# Patient Record
Sex: Female | Born: 1960 | Hispanic: Refuse to answer | Marital: Married
Health system: Midwestern US, Community
[De-identification: ages and names within clinical notes are randomized; demographics above are authoritative.]

## PROBLEM LIST (undated history)

## (undated) DIAGNOSIS — R928 Other abnormal and inconclusive findings on diagnostic imaging of breast: Principal | ICD-10-CM

## (undated) DIAGNOSIS — Z0000 Encounter for general adult medical examination without abnormal findings: Secondary | ICD-10-CM

## (undated) DIAGNOSIS — O039 Complete or unspecified spontaneous abortion without complication: Secondary | ICD-10-CM

## (undated) DIAGNOSIS — M1990 Unspecified osteoarthritis, unspecified site: Secondary | ICD-10-CM

## (undated) DIAGNOSIS — J329 Chronic sinusitis, unspecified: Secondary | ICD-10-CM

## (undated) HISTORY — DX: Complete or unspecified spontaneous abortion without complication: O03.9

## (undated) HISTORY — DX: Chronic sinusitis, unspecified: J32.9

## (undated) HISTORY — DX: Unspecified osteoarthritis, unspecified site: M19.90

---

## 2004-07-04 HISTORY — PX: BREAST EXCISIONAL BIOPSY: SUR124

## 2010-10-03 HISTORY — PX: OTHER SURGICAL HISTORY: SHX169

## 2010-10-05 ENCOUNTER — Inpatient Hospital Stay (HOSPITAL_COMMUNITY)
Admission: AD | Admit: 2010-10-05 | Discharge: 2010-10-08 | DRG: 219 | Disposition: A | Discharge status: Home / Self Care | Payer: BC Managed Care – PPO | Source: Ambulatory Visit | Attending: Orthopedic Surgery | Admitting: Orthopedic Surgery

## 2010-10-05 DIAGNOSIS — Y9379 Activity, other specified sports and athletics: Secondary | ICD-10-CM

## 2010-10-05 DIAGNOSIS — Y998 Other external cause status: Secondary | ICD-10-CM

## 2010-10-05 DIAGNOSIS — S42293A Other displaced fracture of upper end of unspecified humerus, initial encounter for closed fracture: Principal | ICD-10-CM | POA: Diagnosis present

## 2010-10-05 LAB — SURGICAL PCR SCREEN: Staphylococcus aureus: NEGATIVE

## 2010-10-05 LAB — BASIC METABOLIC PANEL
BUN: 9 mg/dL (ref 6–23)
CO2: 30 mEq/L (ref 19–32)
Chloride: 101 mEq/L (ref 96–112)
Creatinine, Ser: 0.72 mg/dL (ref 0.4–1.2)
Glucose, Bld: 98 mg/dL (ref 70–99)
Potassium: 3.4 mEq/L — ABNORMAL LOW (ref 3.5–5.1)

## 2010-10-05 LAB — APTT: aPTT: 36 seconds (ref 24–37)

## 2010-10-05 LAB — CBC
MCHC: 33.4 g/dL (ref 30.0–36.0)
MCV: 91.3 fL (ref 78.0–100.0)
Platelets: 247 10*3/uL (ref 150–400)
RDW: 12.8 % (ref 11.5–15.5)
WBC: 8.4 10*3/uL (ref 4.0–10.5)

## 2010-10-06 ENCOUNTER — Inpatient Hospital Stay (HOSPITAL_COMMUNITY): Payer: BC Managed Care – PPO

## 2010-10-06 NOTE — H&P (Signed)
  Kelly Moore, STOFFER           ACCOUNT NO.:  [id number]  MEDICAL RECORD NO.:  [id number]           PATIENT TYPE:  I  LOCATION:  5041                         FACILITY:  [facility]  PHYSICIAN:  Almedia Balls. Ranell Patrick, M.D. DATE OF BIRTH:  [birth year]  DATE OF ADMISSION:  10/05/2010 DATE OF DISCHARGE:                             HISTORY & PHYSICAL   REASON FOR ADMISSION:  Left shoulder fracture.  HISTORY OF PRESENT ILLNESS:  The patient is a 50 year old left-hand dominant female who sustained a left proximal humerus fracture falling from horse on Sunday.  The patient initially went to [facility], where x-rays were diagnosing a displaced proximal humerus fracture.  Attempts at closure and reduction of the shoulder was not successful.  The patient was referred to [facility] for surgical management.  PAST MEDICAL HISTORY:  Unremarkable.  PAST SURGICAL HISTORY:  Lumpectomy for benign tumor in 2006.  FAMILY MEDICAL HISTORY:  Negative for joint problems.  CURRENT MEDICATIONS:  None.  ALLERGIES:  None.  SOCIAL HISTORY:  The patient sees Dr. Belva Crome down in [place] for primary care.  She denies tobacco use or alcohol use, is left-hand dominant, here with her husband, works for [employer].  PHYSICAL EXAMINATION:  GENERAL:  Healthy-appearing female who is in no acute distress, alert and oriented. NEUROLOGIC:  Examination of cervical spine reveals no midline tenderness.  Normal cervical range of motion.  Left shoulder very swollen and tender.  She is bruised and unable to move her shoulder secondary to her fractured shoulder.  She is in a sling and swathe. Distally, she is able wiggle her fingers and sensation is intact.  Radiographs demonstrate a displaced proximal humerus fracture with 100% anterior shaft displacement.  IMPRESSION:  Left displaced proximal humerus fracture.  PLAN:  Admission for pain control to the hospital.  The patient is fairly miserable and is having to  take Percocets quite frequently.  We will plan on surgery.  We discussed both surgical and nonsurgical treatment options.  With the displacing force of the pectoralis major, this fracture is not amenable to nonsurgical care, and we recommended surgical management.  She is in agreement and willing to proceed and we will plan on that tomorrow with an S3 plate, which was a locking plate.     Almedia Balls. Ranell Patrick, M.D.     SRN/MEDQ  D:  10/05/2010  T:  10/06/2010  Job:  [id number]  Electronically Signed by Malon Kindle  on 10/06/2010 08:08:45 PM

## 2010-10-07 LAB — BASIC METABOLIC PANEL
BUN: 8 mg/dL (ref 6–23)
CO2: 30 mEq/L (ref 19–32)
Calcium: 8.6 mg/dL (ref 8.4–10.5)
Chloride: 103 mEq/L (ref 96–112)
Creatinine, Ser: 0.77 mg/dL (ref 0.4–1.2)
Glucose, Bld: 111 mg/dL — ABNORMAL HIGH (ref 70–99)

## 2010-10-07 LAB — CBC
HCT: 34.7 % — ABNORMAL LOW (ref 36.0–46.0)
Hemoglobin: 11.5 g/dL — ABNORMAL LOW (ref 12.0–15.0)
MCH: 30.2 pg (ref 26.0–34.0)
MCHC: 33.1 g/dL (ref 30.0–36.0)
MCV: 91.1 fL (ref 78.0–100.0)
RBC: 3.81 MIL/uL — ABNORMAL LOW (ref 3.87–5.11)

## 2010-10-27 NOTE — Discharge Summary (Signed)
  Kelly Moore, STOKER           ACCOUNT NO.:  [id number]  MEDICAL RECORD NO.:  [id number]           PATIENT TYPE:  I  LOCATION:  5041                         FACILITY:  [facility]  PHYSICIAN:  Almedia Balls. Ranell Patrick, M.D. DATE OF BIRTH:  January 14, 1961  DATE OF ADMISSION:  10/05/2010 DATE OF DISCHARGE:  10/07/2010                              DISCHARGE SUMMARY   ADMISSION DIAGNOSIS:  Left proximal humerus fracture.  DISCHARGE DIAGNOSIS:  Left proximal humerus fracture status post open reduction and internal fixation.  BRIEF HISTORY:  The patient is a 50 year old female who sustained a ground-level fall from a horse earlier in the week injuring the left shoulder.  The patient was admitted for pain management and also surgical management to increase function and decrease pain.  PROCEDURE:  The patient had a left proximal humerus ORIF by Dr. Malon Kindle on October 06, 2010.  Assistant was [employer].  General anesthesia with interscalene block was used.  No complications.  HOSPITAL COURSE:  The patient was admitted on October 05, 2010, for pain management and also get her ready for surgery on the 4th.  The patient underwent the above-stated procedure on October 06, 2010, which she tolerated well.  After adequate time-out in Post Anesthesia Care Unit, she was transferred back to 5000.  Postop day #1, the patient complained of mild to moderate pain but otherwise was doing fairly well, so worked with occupational therapy very gently and was continued to improve throughout the day and thus the patient will be discharged home on postop day #1.  Her labs were within normal limits and otherwise she did quite well, was afebrile.  DISCHARGE PLAN:  The patient will be discharged home on October 07, 2010. Her diet is regular.  Her condition is stable.  DISCHARGE MEDICATIONS: 1. Robaxin 500 mg p.o. q.6 h. 2. Percocet 5/325 1-2 tablets q.4-6 h. p.r.n. pain.  FOLLOWUP:  The patient to follow back up  with Dr. Malon Kindle in 2 weeks.  The patient has no known drug allergies.     Thomas B. Dixon, P.A.   ______________________________ Almedia Balls. Ranell Patrick, M.D.    TBD/MEDQ  D:  10/07/2010  T:  10/08/2010  Job:  [id number]  Electronically Signed by Standley Dakins P.A. on 10/19/2010 10:18:36 AM Electronically Signed by Malon Kindle  on 10/27/2010 05:15:44 PM

## 2010-10-27 NOTE — Op Note (Signed)
NAMEBETSY, Kelly Moore           ACCOUNT NO.:  [id number]  MEDICAL RECORD NO.:  [id number]           PATIENT TYPE:  I  LOCATION:  5041                         FACILITY:  [facility]  PHYSICIAN:  Almedia Balls. Ranell Patrick, M.D. DATE OF BIRTH:  Dec 11, 1960  DATE OF PROCEDURE:  10/06/2010 DATE OF DISCHARGE:                              OPERATIVE REPORT   PREOPERATIVE DIAGNOSIS:  Left shoulder displaced and comminuted proximal humerus fracture.  POSTOPERATIVE DIAGNOSIS:  Left shoulder displaced and comminuted proximal humerus fracture.  PROCEDURE PERFORMED:  Open reduction and internal fixation of left proximal humerus using DePuy SNP.  SURGEON:  Almedia Balls. Ranell Patrick, MD  ASSISTANT:  [place]esto Charon, [place].  General anesthesia was used plus interscalene block.  ESTIMATED BLOOD LOSS:  Less than 100 mL.  FLUID REPLACED:  1500 mL crystalloid.  Counts were correct.  There were no complications.  Preoperative antibiotics were given.  INDICATIONS:  The patient is a 49-year female with left displaced proximal humerus fracture sustained falling from a horse.  The patient presents with a significantly angulated fracture with complete displacement of fracture site.  I discussed with the patient the recommendation for open reduction and internal fixation and restore proximal humeral anatomy.  She agreed with this.  Informed consent was obtained.  DESCRIPTION OF PROCEDURE:  After adequate level of anesthesia was achieved, the patient was placed in a modified beach-chair position. Left shoulder was sterilely prepped and draped in usual manner.  Using deltopectoral incision starting at the coracoid process extending down the anterior humerus, dissection down through the subcutaneous tissue. Cephalic vein identified, taken laterally with the deltoid, pectoralis taken medially.  Pectoralis was indeed attached to the distal fragment, was pulling it anteriorly and proximally.  We were able to identify  the fracture site.  There was comminution noted there that was not noted on preoperative x-rays.  I made a decision at this time to proceed with a nail plate device which seemed to well in a comminuted situation providing excellent stability, but not relying on completely rigid stability and fixation for successive healing.  We went ahead and removed just a little bit of bone off the distal fragment on the lateral side just lateral to the biceps groove.  We then placed the SNP in the distal humerus.  We then reduced the fracture anatomically and placed a clamp across the fracture site and that was there at the oblique portion of the fracture distal to the head and the diaphysis portion.  We then checked our reduction on multiple views with C-arm.  We placed our central localizing pin into the center of the head and then started placing our non-threaded pegs which were locking pegs, did lock into the plate.  We got 5 good pegs, all checked in multiple views with C-arm and were in bone.  At this point, we went ahead and placed our 3 distal unicortical screws that locked into plate.  We had the top screw cross threaded and sheared off, but did locked in the plate and the bottom 2 screws had good purchase.  Once we had verified that all hardware was in appropriate position, we removed  the clamp and ranged the shoulder fully, saw absolutely no relative motion at all through the full arc of motion, felt like we had a nice stable construct and near-anatomic reduction.  I thoroughly irrigated the wounds and then closed the deltopectoral interval with 0 Vicryl suture followed 2-0 Vicryl suture followed by 4-0 Monocryl to skin.  Steri-Strips applied followed by sterile dressing.  The patient tolerated the surgery well.     Almedia Balls. Ranell Patrick, M.D.     SRN/MEDQ  D:  10/06/2010  T:  10/07/2010  Job:  [id number]  Electronically Signed by Malon Kindle  on 10/27/2010 05:15:47 PM

## 2012-05-09 IMAGING — CR DG SHOULDER 1V*L*
1 series · 1 of 1 positions shown · non-contrast
Comparison: None.

CLINICAL DATA: Postop for shoulder surgery.

PORTABLE LEFT SHOULDER - 2+ VIEW

[view not recorded]
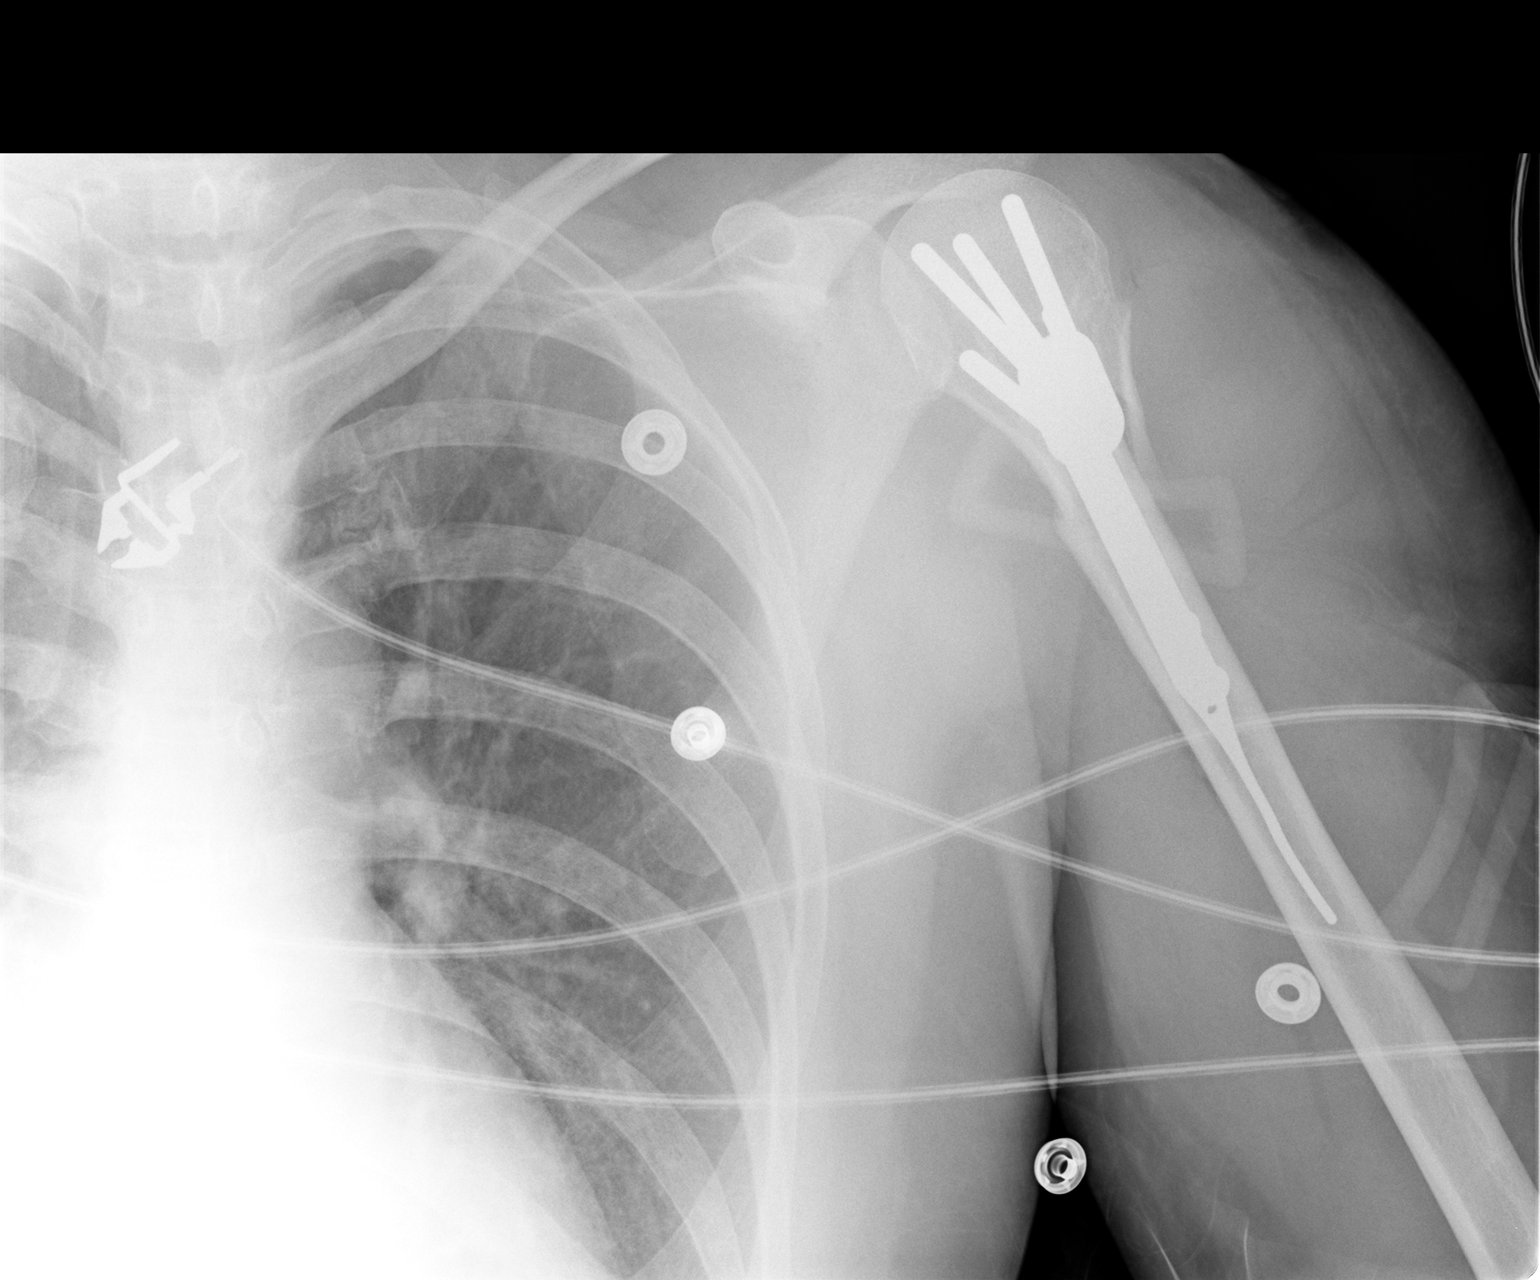

[1 of 1 positions shown; findings below may reference images not displayed]

FINDINGS: Single AP view of the left shoulder.  Postoperative
changes in the proximal left humerus with apparent fixation of a
proximal humeral fracture.  Near anatomic alignment is suggested in
the single view available.
IMPRESSION: Postoperative change left shoulder.

## 2015-09-17 DIAGNOSIS — M722 Plantar fascial fibromatosis: Secondary | ICD-10-CM | POA: Insufficient documentation

## 2015-09-17 DIAGNOSIS — M6700 Short Achilles tendon (acquired), unspecified ankle: Secondary | ICD-10-CM | POA: Insufficient documentation

## 2015-09-17 DIAGNOSIS — Q828 Other specified congenital malformations of skin: Secondary | ICD-10-CM | POA: Insufficient documentation

## 2015-12-30 ENCOUNTER — Ambulatory Visit (INDEPENDENT_AMBULATORY_CARE_PROVIDER_SITE_OTHER): Payer: BLUE CROSS/BLUE SHIELD | Admitting: Sports Medicine

## 2015-12-30 ENCOUNTER — Encounter: Payer: Self-pay | Admitting: Sports Medicine

## 2015-12-30 DIAGNOSIS — M79672 Pain in left foot: Secondary | ICD-10-CM

## 2015-12-30 DIAGNOSIS — Q828 Other specified congenital malformations of skin: Secondary | ICD-10-CM

## 2015-12-30 NOTE — Progress Notes (Signed)
Patient ID: Kelly Moore, female   DOB: 10/12/1960, 55 y.o.   MRN: [id number] Subjective: Kelly GawCatherine Moore is a 55 y.o. female patient who presents to office for evaluation of Left foot pain secondary to callus skin. Patient complains of pain at the lesion present Left foot at the heel; feels like something is in her heel; states that started to worsen in february went to urgent care with give antibiotics with no resolution. Also saw a podiatrist who trimmed it and was told to use compound W with no improvement.  Patient has tried soaking with no relief in symptoms. Admits to a history of plantar fasciitis and [place] Patient denies any other pedal complaints.   Patient Active Problem List   Diagnosis Date Noted  . Plantar fasciitis 09/17/2015  . Porokeratosis 09/17/2015  . 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   No current facility-administered medications on file prior to visit.    No Known Allergies  Objective:  General: Alert and oriented x3 in no acute distress  Dermatology: Keratotic lesion present left heel with skin lines transversing the lesion, pain is present with direct pressure to the lesion with a central nucleated core noted, no webspace macerations, no ecchymosis bilateral, all nails x 10 are well manicured.  Vascular: Dorsalis Pedis and Posterior Tibial pedal pulses 2/4, Capillary Fill Time 3 seconds, + pedal hair growth bilateral, no edema bilateral lower extremities, Temperature gradient within normal limits.  Neurology: Michaell CowingGross sensation intact via light touch bilateral.  Musculoskeletal: Mild tenderness with palpation at the keratotic lesion site on Left, Muscular strength 5/5 in all groups without pain or limitation on range of motion. No lower extremity muscular or boney deformity noted.  Assessment and Plan: Problem List Items Addressed This Visit       Musculoskeletal and Integument   Porokeratosis - Primary    Other Visit Diagnoses    Pain, heel, left          -Complete examination performed -Discussed treatment options -Parred keratoic lesion using a chisel blade; treated the area with Salinocaine covered with moleskin -Encouraged daily skin emollients -Encouraged use of pumice stone -Advised good supportive shoes and inserts -Patient to return to office as needed or sooner if condition worsens. Advised patient to consider punch removal of lesion if no relief after today's trim.   Asencion Islamitorya Marjie Chea, DPM

## 2019-07-24 ENCOUNTER — Other Ambulatory Visit: Payer: Self-pay

## 2019-07-24 ENCOUNTER — Encounter: Payer: Self-pay | Admitting: Diagnostic Neuroimaging

## 2019-07-24 ENCOUNTER — Ambulatory Visit: Payer: BC Managed Care – PPO | Admitting: Diagnostic Neuroimaging

## 2019-07-24 VITALS — BP 122/77 | HR 118 | Temp 96.6°F | Ht 64.0 in | Wt 119.6 lb

## 2019-07-24 DIAGNOSIS — R6884 Jaw pain: Secondary | ICD-10-CM

## 2019-07-24 MED ORDER — CARBAMAZEPINE ER 200 MG PO CP12: 200.0000 mg | ORAL_CAPSULE | Freq: Two times a day (BID) | ORAL | 6 refills | Status: AC

## 2019-07-24 NOTE — Patient Instructions (Signed)
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

## 2019-07-24 NOTE — Progress Notes (Signed)
[facility]  PATIENT: Kelly Moore DOB: Nov 04, 1960  REFERRING CLINICIAN: Greig Right, MD HISTORY FROM: patient  REASON FOR VISIT: new consult    HISTORICAL  CHIEF COMPLAINT:  Chief Complaint  Patient presents with  . Pain    rm 6 New Pt, husband- Ronalee Belts "facial pain in April, dentist dx TMJ, made mouth splint and it went away; came back 2 weeks ago, last Fri was electrical shocks of pain; went to oral surgeon who stated it's not TMJ or related to my teeth"    HISTORY OF PRESENT ILLNESS:   59 year old female here for evaluation of right facial pain.  Symptoms started in April 2020 and lasted for a week or 2 and then resolved.  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  Symptoms recurred in January 2021.  Started with dull aching pain, progressing to intense severe electrical shocklike pain in her right lower jaw.  Patient went to dentist, oral surgeon, had CT scan of her jaw and teeth which were unremarkable.  No specific dental pathology was found.  Patient was referred here for further evaluation.  Patient having trouble eating, talking, relaxing.  Patient has tried muscle relaxers, hydrocodone, pain medications without relief.  Also patient had diagnosis of Covid in June 29, 2019.  She has had loss of taste since that time.    REVIEW OF SYSTEMS: Full 14 system review of systems performed and negative with exception of: As per HPI.  ALLERGIES: No Known Allergies  HOME MEDICATIONS: Outpatient Medications Prior to Visit  Medication Sig Dispense Refill  . diazepam (VALIUM) 5 MG tablet 5 mg. 1-2 x day as needed    . HYDROcodone-acetaminophen (NORCO/VICODIN) 5-325 MG tablet 1 tablet every 6 (six) hours. As needed    . meloxicam (MOBIC) 15 MG tablet Take 15 mg by mouth.    Karma Greaser 4 MG/0.1ML LIQD nasal spray kit 1 spray once. As needed    . amoxicillin-clavulanate (AUGMENTIN) 875-125 MG tablet     .  levofloxacin (LEVAQUIN) 750 MG tablet     . meloxicam (MOBIC) 15 MG tablet     . methocarbamol (ROBAXIN) 750 MG tablet Take 750 mg by mouth 5 (five) times daily as needed. Every 4 hrs as needed    . predniSONE (DELTASONE) 20 MG tablet     . PROAIR HFA 108 (90 Base) MCG/ACT inhaler      No facility-administered medications prior to visit.    PAST MEDICAL HISTORY: Past Medical History:  Diagnosis Date  . Arthritis   . Miscarriage   . Sinusitis    hx of    PAST SURGICAL HISTORY: Past Surgical History:  Procedure Laterality Date  . arm fracture Left 10/2010   metal plate  . BREAST LUMPECTOMY Right 2006    FAMILY HISTORY: Family History  Problem Relation Age of Onset  . Breast cancer Mother   . Dementia Mother   . Prostate cancer Father   . Hypertension Father   . Heart attack Father   . Thyroid disease Sister   . Stroke Brother     SOCIAL HISTORY: Social History   Socioeconomic History  . Marital status: Married    Spouse name: Ronalee Belts  . Number of children: 0  . Years of education: Not on file  . Highest education level: High school graduate  Occupational History  . Not on file  Tobacco Use  . Smoking status: Never Smoker  . Smokeless tobacco:  Never Used  Substance and Sexual Activity  . Alcohol use: Yes    Alcohol/week: 0.0 standard drinks    Comment: occass  . Drug use: Never  . Sexual activity: Not on file  Other Topics Concern  . Not on file  Social History Narrative   Lives with husband   Caffeine -coffee 2-3 c, occass tea   Social Determinants of Health   Financial Resource Strain:   . Difficulty of Paying Living Expenses: Not on file  Food Insecurity:   . Worried About Charity fundraiser in the Last Year: Not on file  . Ran Out of Food in the Last Year: Not on file  Transportation Needs:   . Lack of Transportation (Medical): Not on file  . Lack of Transportation (Non-Medical): Not on file  Physical Activity:   . Days of Exercise per Week:  Not on file  . Minutes of Exercise per Session: Not on file  Stress:   . Feeling of Stress : Not on file  Social Connections:   . Frequency of Communication with Friends and Family: Not on file  . Frequency of Social Gatherings with Friends and Family: Not on file  . Attends Religious Services: Not on file  . Active Member of Clubs or Organizations: Not on file  . Attends Archivist Meetings: Not on file  . Marital Status: Not on file  Intimate Partner Violence:   . Fear of Current or Ex-Partner: Not on file  . Emotionally Abused: Not on file  . Physically Abused: Not on file  . Sexually Abused: Not on file     PHYSICAL EXAM  GENERAL EXAM/CONSTITUTIONAL: Vitals:  Vitals:   07/24/19 1039  BP: 122/77  Pulse: (!) 118  Temp: (!) 96.6 F (35.9 C)  Weight: 119 lb 9.6 oz (54.3 kg)  Height: '5\' 4"'$  (1.626 m)   Body mass index is 20.53 kg/m. Wt Readings from Last 3 Encounters:  07/24/19 119 lb 9.6 oz (54.3 kg)    Patient is in [place] / DISTRESS FROM RIGHT JAW PAIN; well developed, nourished and groomed; neck is supple  CARDIOVASCULAR:  Examination of carotid arteries is normal; no carotid bruits  Regular rate and rhythm, no murmurs  Examination of peripheral vascular system by observation and palpation is normal  EYES:  Ophthalmoscopic exam of optic discs and posterior segments is normal; no papilledema or hemorrhages No exam data present  MUSCULOSKELETAL:  Gait, strength, tone, movements noted in Neurologic exam below  NEUROLOGIC: MENTAL STATUS:  No flowsheet data found.  awake, alert, oriented to person, place and time  recent and remote memory intact  normal attention and concentration  language fluent, comprehension intact, naming intact  fund of knowledge appropriate  CRANIAL NERVE:   2nd - no papilledema on fundoscopic exam  2nd, 3rd, 4th, 6th - pupils equal and reactive to light, visual fields full to confrontation, extraocular  muscles intact, no nystagmus  5th - facial sensation symmetric  7th - facial strength symmetric  8th - hearing intact  9th - palate elevates symmetrically, uvula midline  11th - shoulder shrug symmetric  12th - tongue protrusion midline  MOTOR:   normal bulk and tone, full strength in the BUE, BLE  SENSORY:   normal and symmetric to light touch, temperature, vibration  COORDINATION:   finger-nose-finger, fine finger movements normal  REFLEXES:   deep tendon reflexes present and symmetric  GAIT/STATION:   narrow based gait     DIAGNOSTIC DATA (LABS,  IMAGING, TESTING) - I reviewed patient records, labs, notes, testing and imaging myself where available.  Lab Results  Component Value Date   WBC 6.9 10/07/2010   HGB 11.5 (L) 10/07/2010   HCT 34.7 (L) 10/07/2010   MCV 91.1 10/07/2010   PLT 204 10/07/2010      Component Value Date/Time   NA 141 10/07/2010 0335   K 3.7 10/07/2010 0335   CL 103 10/07/2010 0335   CO2 30 10/07/2010 0335   GLUCOSE 111 (H) 10/07/2010 0335   BUN 8 10/07/2010 0335   CREATININE 0.77 10/07/2010 0335   CALCIUM 8.6 10/07/2010 0335   GFRNONAA >60 10/07/2010 0335   GFRAA  10/07/2010 0335    >60        The eGFR has been calculated using the MDRD equation. This calculation has not been validated in all clinical situations. eGFR's persistently <60 mL/min signify possible Chronic Kidney Disease.   No results found for: CHOL, HDL, LDLCALC, LDLDIRECT, TRIG, CHOLHDL No results found for: HGBA1C No results found for: VITAMINB12 No results found for: TSH     ASSESSMENT AND PLAN  59 y.o. year old female here with right lower jaw and facial pain since April 2020, intermittent shocklike sensations, most likely representing trigeminal neuralgia.  Will check MRI of the brain to rule out secondary causes.  We will empirically try carbamazepine for symptom control.  Dx:  1. Jaw pain     PLAN:  RIGHT TRIGEMINAL NEURALGIA - start  carbamazepine 295m twice a day  - check MRI brain w/wo  Orders Placed This Encounter  Procedures  . MR BRAIN W WO CONTRAST   Meds ordered this encounter  Medications  . carbamazepine (CARBATROL) 200 MG 12 hr capsule    Sig: Take 1 capsule (200 mg total) by mouth 2 (two) times daily.    Dispense:  60 capsule    Refill:  6   Return in about 3 months (around 10/22/2019).    VPenni Bombard MD 13/73/4287 168:11AM Certified in Neurology, Neurophysiology and Neuroimaging  [facility] [address] [place] [phone]

## 2019-07-25 ENCOUNTER — Telehealth: Payer: Self-pay | Admitting: Diagnostic Neuroimaging

## 2019-07-25 NOTE — Telephone Encounter (Signed)
Kelly Moore,Kelly on DPR) has called to report that something needs to be done re: pt working.  Husband states pt unable to work due to high level of pain and also pt is not eating much at all.  Pt husband is asking for a call from RN

## 2019-07-25 NOTE — Telephone Encounter (Signed)
LVM advising husband to have her talk to HR at her work to discuss being out of work. If forms need to be filled out, her PCP can or we will fill out for $50 fee. I advised that hopefully her medication will begin to make her feel better soon. She can try protein drinks, drink water so she doesn't get dehydrated. Left # for questions.

## 2019-08-01 ENCOUNTER — Telehealth: Payer: Self-pay

## 2019-08-01 NOTE — Telephone Encounter (Signed)
Called patient and advised Dr Marjory Lies recommends she go to ED or Urgent Care asap. She asked if she should go even if she is feeling somewhat better. I advised her she should particularly due to shortness of breath and fluctuating HR. She asked if about PCP, and I advised she go to ED or Urgent Care. She then asked about her MRI; I advised will let MRI coordinators know she is asking . I advised her the process of getting insurance approval can take 1-2 weeks. She agreed to seek medical attention,  verbalized understanding, appreciation.

## 2019-08-01 NOTE — Telephone Encounter (Signed)
Recommend to stop medication and go to ER / urgent care asap. -VRP

## 2019-08-01 NOTE — Telephone Encounter (Signed)
I took call from phone staff. Patient reports bruising, a red rash, shaking, pulse rates from 30's to 90's, shortness of breath, and tingling in hands. She has been on  Carbamazepine 200 mg twice daily since 07/24/19. She has taken her morning dose today. I advised her will discuss with Dr Marjory Lies and call her back. In meantime if any of her symptoms worsen I advised she call seek medical attention. She expressed concern over stopping medication "suddenlly". I reassured her that her next dose today isn't due until tonight, and I'll call her before then. She verbalized understanding, appreciation.

## 2019-08-01 NOTE — Telephone Encounter (Signed)
Patient called stating she is having a medication reaction to the carbamazepine (CARBATROL) 200 MG 12 hr capsule  States she has developed a rash, balance is off, trouble breathing, shortness of breath, tingling/shaking in both hands

## 2019-08-01 NOTE — Telephone Encounter (Signed)
Called patient who stated she went to urgent care. They sent her to [facility] ED where she had EKG, CXR, labs. She was told everything was okay.  She feels better. I advised her we are closed tomorrow, but we have MD on call on weekends. She will not take carbamazepine any longer. She  verbalized understanding, appreciation of call.

## 2019-08-06 ENCOUNTER — Telehealth: Payer: Self-pay | Admitting: Diagnostic Neuroimaging

## 2019-08-06 NOTE — Telephone Encounter (Signed)
[employer] Auth: [id number] (exp. 08/06/19 to 10/04/19) order sent to [facility]. They will reach out to the patient to schedule.Marland Kitchen [facility] is out of network for the MRI [facility] is in network.

## 2019-08-08 ENCOUNTER — Telehealth: Payer: Self-pay | Admitting: *Deleted

## 2019-08-08 NOTE — Telephone Encounter (Signed)
Received call from patient who was crying with facial pain. She stated that she had to go to   Urgent care Sunday for her pain, was prescribed gabapentin, buspirone, baclofen, hydrocodone. She stated that she then saw PCP who increased gabapentin from 300 mg twice daily to 600 mg twice daily. PCP added hydroxyzine which she hasn't picked up. She is having a lot of difficulty eating/drinking.   I messaged Dr Marjory Lies who recommended she continue all medications, and if not able to eat, may need admission to hospital by PCP.  He can also refer to [facility] for gamma knife/surgery evaluation.  I advised the patient continue all meds as prescribed, take hydrocodone every 6 hours to try and get pain under control. If unable to eat, drink she may need to be admitted. She asked if she should go to [facility], I informed her that it is her choice and she can request medical records be sent to Dr Marjory Lies, as [facility] is not in [facility]. She then asked me to repeat information to husband; I did so. He stated they will call her PCP. I informed him we have MD on call on weekends. He verbalized understanding, appreciation.

## 2019-08-17 ENCOUNTER — Ambulatory Visit
Admission: RE | Admit: 2019-08-17 | Discharge: 2019-08-17 | Disposition: A | Discharge status: Home / Self Care | Payer: BC Managed Care – PPO | Source: Ambulatory Visit | Attending: Diagnostic Neuroimaging | Admitting: Diagnostic Neuroimaging

## 2019-08-17 DIAGNOSIS — R6884 Jaw pain: Secondary | ICD-10-CM

## 2019-08-17 MED ORDER — GADOBENATE DIMEGLUMINE 529 MG/ML IV SOLN
10.0000 mL | Freq: Once | INTRAVENOUS | Status: AC | PRN
  Administered 2019-08-17: 16:00:00 10 mL via INTRAVENOUS

## 2019-08-26 ENCOUNTER — Telehealth: Payer: Self-pay | Admitting: Diagnostic Neuroimaging

## 2019-08-26 DIAGNOSIS — R6884 Jaw pain: Secondary | ICD-10-CM

## 2019-08-26 NOTE — Telephone Encounter (Signed)
Pt is asking for a call with MRI results once available

## 2019-08-27 NOTE — Telephone Encounter (Signed)
See result note. _VRP 

## 2019-08-28 NOTE — Telephone Encounter (Signed)
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 She may consider a neurosurgery consult for evaluation and discussion of possible surgery options or treatment.  She would like referral placed. I advised Dr Marjory Lies will place referral. She needs to give 1-2 weeks to get call for scheduling with neurosurgeon. She verbalized understanding, appreciation.

## 2019-08-29 NOTE — Addendum Note (Signed)
Addended by: [place] Pink on: 08/29/2019 04:26 PM   Modules accepted: Orders

## 2019-08-29 NOTE — Telephone Encounter (Signed)
Per Dr Marjory Lies, referral placed to Dr Angelyn Punt, [facility] for evaluation of MRI brian/IAC results, re: jaw pain.

## 2019-09-11 ENCOUNTER — Encounter: Payer: Self-pay | Admitting: *Deleted

## 2019-10-22 ENCOUNTER — Ambulatory Visit: Payer: BC Managed Care – PPO | Admitting: Diagnostic Neuroimaging

## 2021-02-12 ENCOUNTER — Other Ambulatory Visit: Payer: Self-pay | Admitting: Family Medicine

## 2021-02-12 DIAGNOSIS — R928 Other abnormal and inconclusive findings on diagnostic imaging of breast: Secondary | ICD-10-CM

## 2021-03-01 ENCOUNTER — Other Ambulatory Visit: Payer: Self-pay

## 2021-03-01 ENCOUNTER — Other Ambulatory Visit: Payer: Self-pay | Admitting: Family Medicine

## 2021-03-01 ENCOUNTER — Ambulatory Visit
Admission: RE | Admit: 2021-03-01 | Discharge: 2021-03-01 | Disposition: A | Discharge status: Home / Self Care | Payer: BC Managed Care – PPO | Source: Ambulatory Visit | Attending: Family Medicine | Admitting: Family Medicine

## 2021-03-01 ENCOUNTER — Ambulatory Visit: Admission: RE | Admit: 2021-03-01 | Payer: BC Managed Care – PPO | Source: Ambulatory Visit

## 2021-03-01 DIAGNOSIS — R921 Mammographic calcification found on diagnostic imaging of breast: Secondary | ICD-10-CM

## 2021-03-01 DIAGNOSIS — R928 Other abnormal and inconclusive findings on diagnostic imaging of breast: Secondary | ICD-10-CM

## 2021-03-11 ENCOUNTER — Ambulatory Visit
Admission: RE | Admit: 2021-03-11 | Discharge: 2021-03-11 | Disposition: A | Discharge status: Home / Self Care | Payer: BC Managed Care – PPO | Source: Ambulatory Visit | Attending: Family Medicine | Admitting: Family Medicine

## 2021-03-11 ENCOUNTER — Other Ambulatory Visit: Payer: Self-pay

## 2021-03-11 DIAGNOSIS — R921 Mammographic calcification found on diagnostic imaging of breast: Secondary | ICD-10-CM

## 2021-12-25 LAB — FECAL DNA COLORECTAL CANCER SCREENING (COLOGUARD): FIT-DNA (Cologuard): NEGATIVE

## 2022-10-13 IMAGING — MG MM BREAST BX W LOC DEV EA AD LESION IMG BX SPEC STEREO GUIDE*L*
5 series · 5 of 17 positions shown · non-contrast
Comparison: Previous exams.
COMPARISON: Previous exams.

Addendum:
CLINICAL DATA: Patient presents for stereotactic guided core biopsy
of LEFT breast calcifications.

EXAM:
LEFT BREAST STEREOTACTIC CORE NEEDLE BIOPSY

[L (1 of 2)]
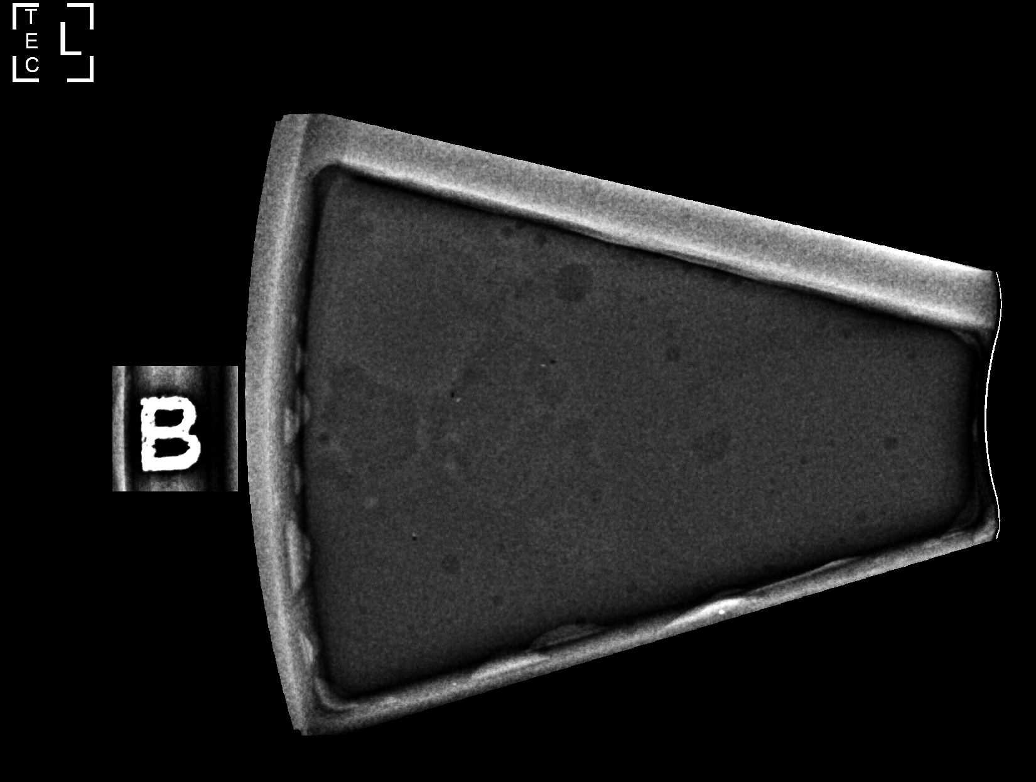

[L (2 of 2)]
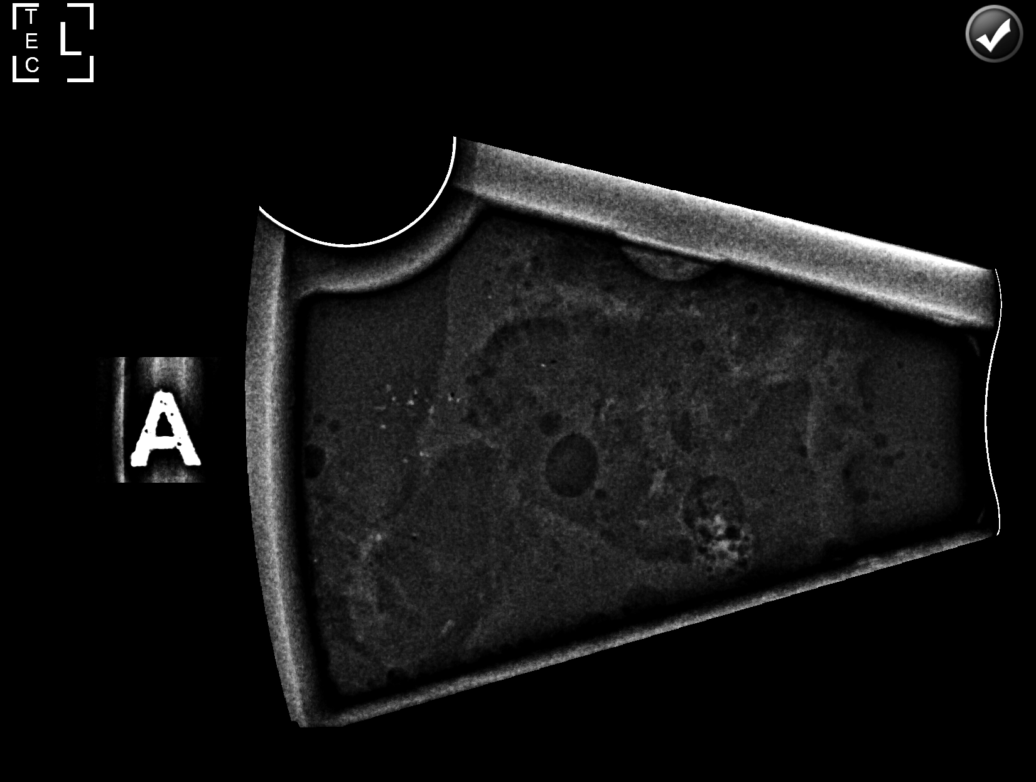

[L CC tomo (1 of 3) · tomo slice 23/46.0]
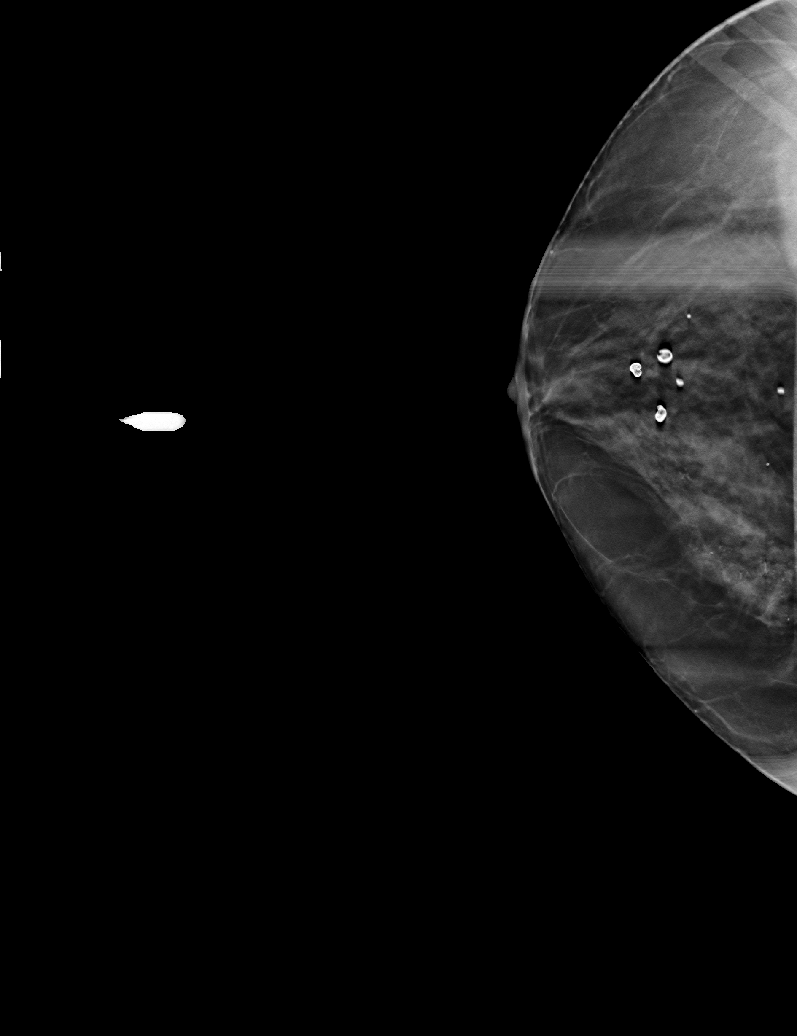

[L CC tomo (2 of 3) · tomo slice 23/45.0]
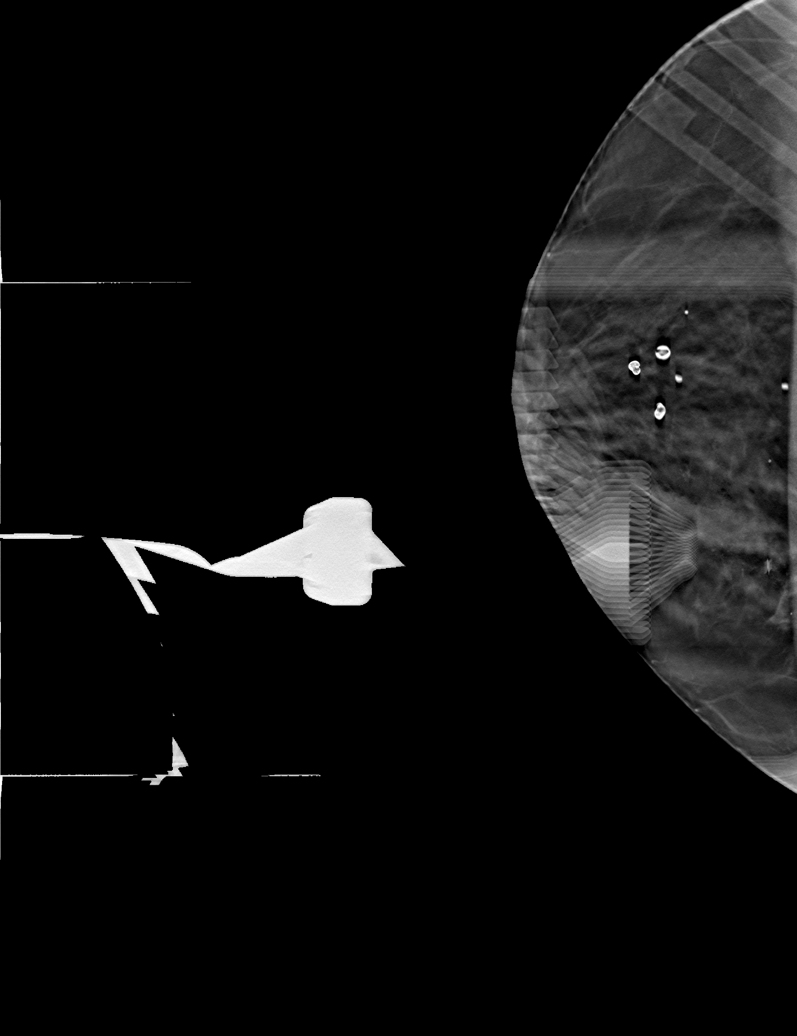

[L CC tomo (3 of 3) · tomo slice 23/46.0]
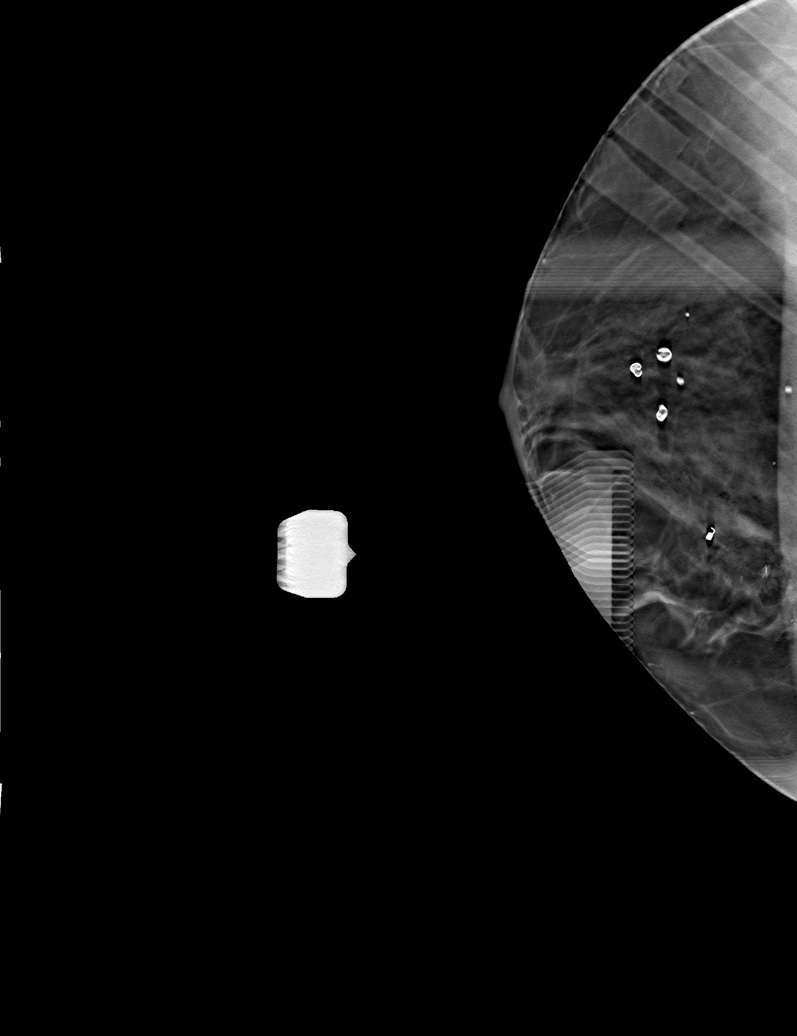

[5 of 17 positions shown; findings below may reference images not displayed]



Site 1: Lesion quadrant: UPPER-OUTER QUADRANT LEFT breast, posterior
calcifications, X clip

Using sterile technique and 1% lidocaine as local anesthetic, under
stereotactic guidance, a 9 gauge vacuum assisted device was used to
perform core needle biopsy of calcifications posterior UPPER OUTER
QUADRANT of the LEFT breast using a craniocaudal approach. Specimen
radiograph was performed showing calcifications in numerous tissue
samples. Specimens with calcifications are identified for pathology.

At the conclusion of the procedure, X tissue marker clip was
deployed into the biopsy cavity.

Site 2: Lesion quadrant: Anterior UPPER-OUTER QUADRANT breast, coil
clip

Using sterile technique and 1% lidocaine as local anesthetic, under
stereotactic guidance, a 9 gauge vacuum assisted device was used to
perform core needle biopsy of calcifications in the anterior UPPER
OUTER QUADRANT of the LEFT breast using a craniocaudal approach.
Specimen radiograph was performed showing numerous calcifications in
tissue samples. Specimens with calcifications are identified for
pathology.

At the conclusion of the procedure, coil shaped tissue marker clip
was deployed into the biopsy cavity.

Follow-up 2-view mammogram was performed and dictated separately.
IMPRESSION: Stereotactic-guided biopsy of anterior and posterior aspects of
calcifications in the UPPER-OUTER QUADRANT of the LEFT breast. No
apparent complications.

ADDENDUM:
Pathology revealed SCLEROSING ADENOSIS WITH CALCIFICATIONS of the
LEFT breast, upper outer posterior, (x clip). This was found to be
concordant by Dr. Quirijn Amazigh.

Pathology revealed ADENOSIS AND FIBROCYSTIC CHANGES WITH
CALCIFICATIONS of the LEFT breast, upper outer anterior, (coil
clip). This was found to be concordant by Dr. Quirijn Amazigh.

Pathology results were discussed with the patient by telephone. The
patient reported doing well after the biopsies with tenderness and
itching at the sites. Post biopsy instructions and care were
reviewed and questions were answered. The patient was encouraged to
call The [REDACTED] for any additional
concerns.

The patient was instructed to return for annual screening
mammography at [facility] in [HOSPITAL][HOSPITAL].

Pathology results reported by Aleksandr Akhter, RN on 03/15/2021.



Site 1: Lesion quadrant: UPPER-OUTER QUADRANT LEFT breast, posterior
calcifications, X clip

Using sterile technique and 1% lidocaine as local anesthetic, under
stereotactic guidance, a 9 gauge vacuum assisted device was used to
perform core needle biopsy of calcifications posterior UPPER OUTER
QUADRANT of the LEFT breast using a craniocaudal approach. Specimen
radiograph was performed showing calcifications in numerous tissue
samples. Specimens with calcifications are identified for pathology.

At the conclusion of the procedure, X tissue marker clip was
deployed into the biopsy cavity.

Site 2: Lesion quadrant: Anterior UPPER-OUTER QUADRANT breast, coil
clip

Using sterile technique and 1% lidocaine as local anesthetic, under
stereotactic guidance, a 9 gauge vacuum assisted device was used to
perform core needle biopsy of calcifications in the anterior UPPER
OUTER QUADRANT of the LEFT breast using a craniocaudal approach.
Specimen radiograph was performed showing numerous calcifications in
tissue samples. Specimens with calcifications are identified for
pathology.

At the conclusion of the procedure, coil shaped tissue marker clip
was deployed into the biopsy cavity.

Follow-up 2-view mammogram was performed and dictated separately.
IMPRESSION: Stereotactic-guided biopsy of anterior and posterior aspects of
calcifications in the UPPER-OUTER QUADRANT of the LEFT breast. No
apparent complications.

## 2022-10-13 IMAGING — MG MM BREAST LOCALIZATION CLIP
4 series · 4 of 12 positions shown · non-contrast
Comparison: Previous exam(s).

CLINICAL DATA: Status post stereotactic guided core biopsy of
calcifications in the UPPER OUTER QUADRANT of the LEFT breast.

EXAM:
3D DIAGNOSTIC LEFT MAMMOGRAM POST STEREOTACTIC BIOPSY x2

[L CC synth-2D]
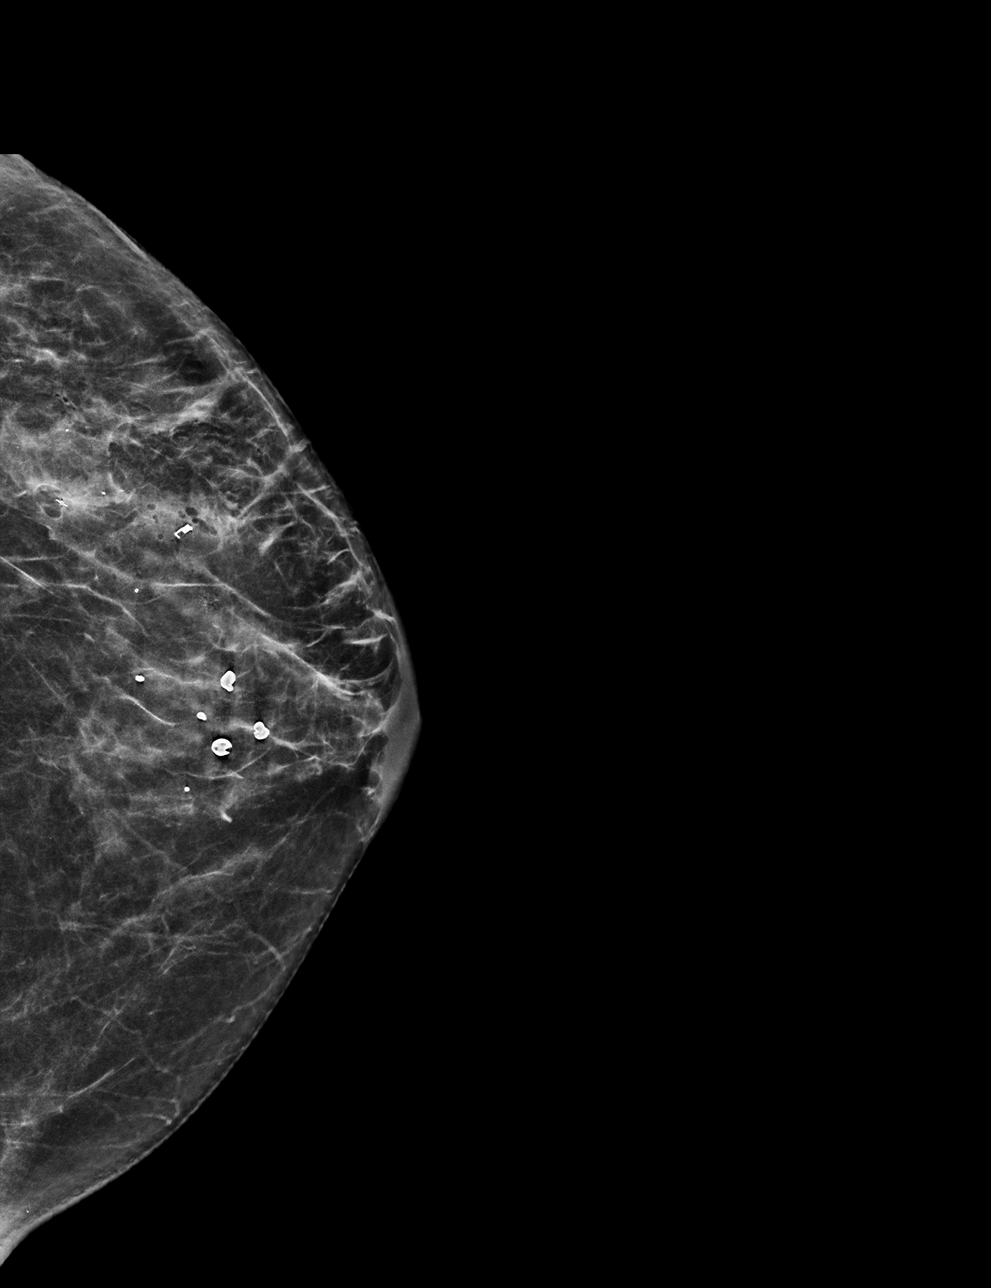

[L ML synth-2D]
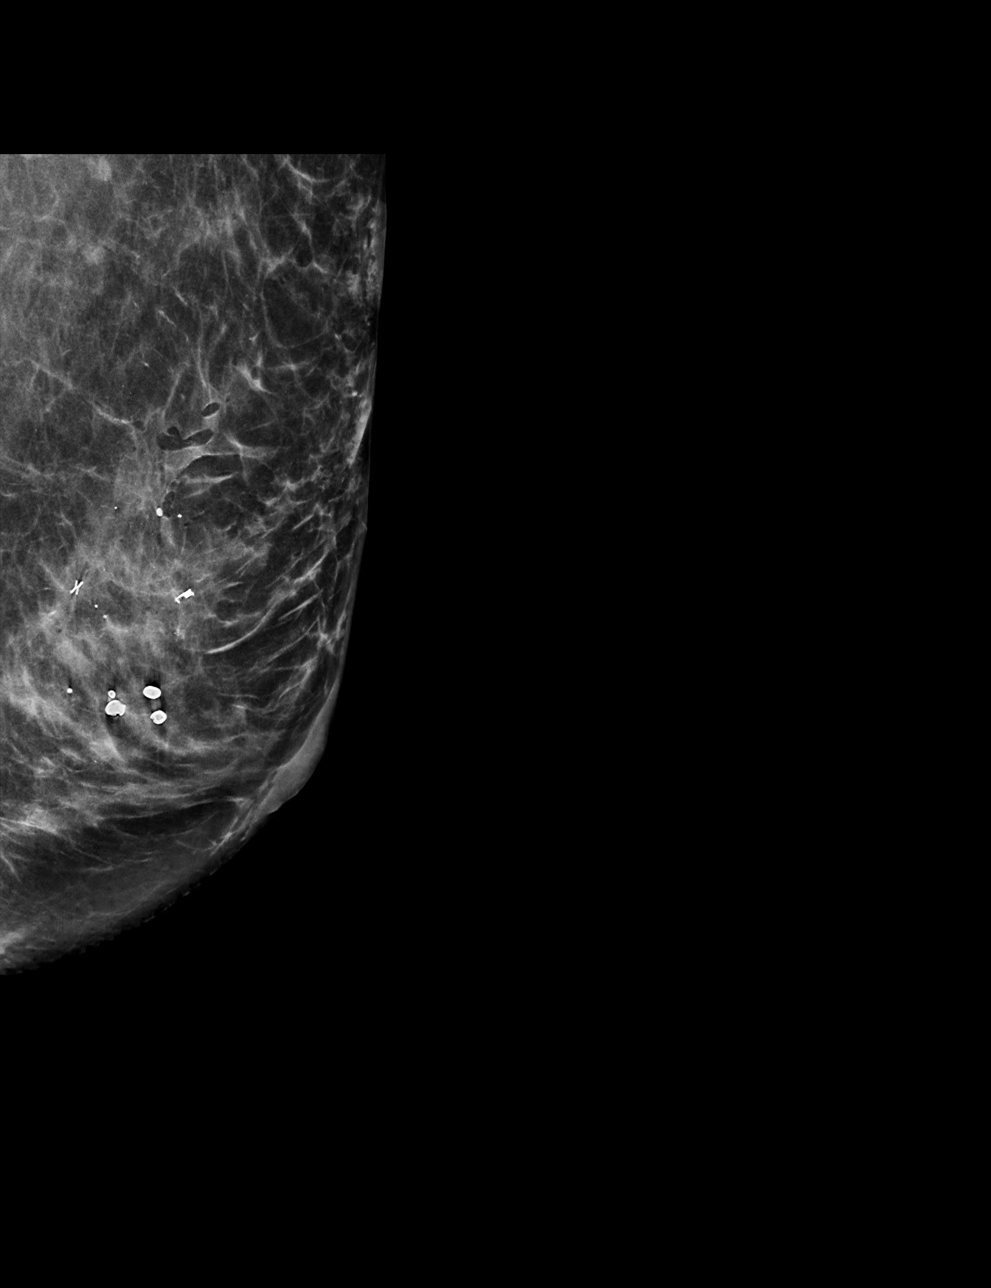

[L CC tomo · tomo slice 29/58.0]
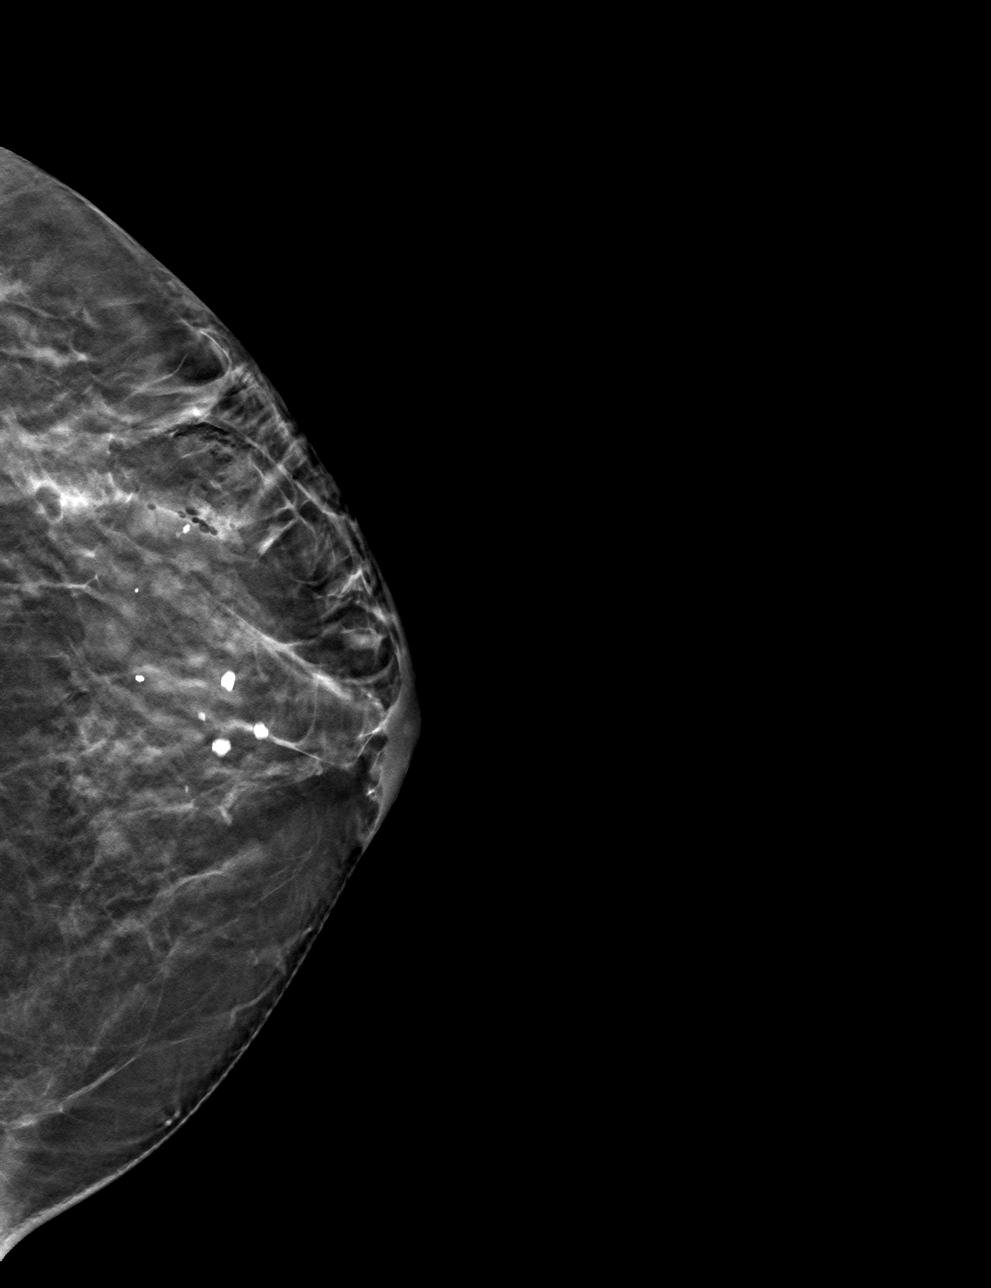

[L ML tomo · tomo slice 33/66.0]
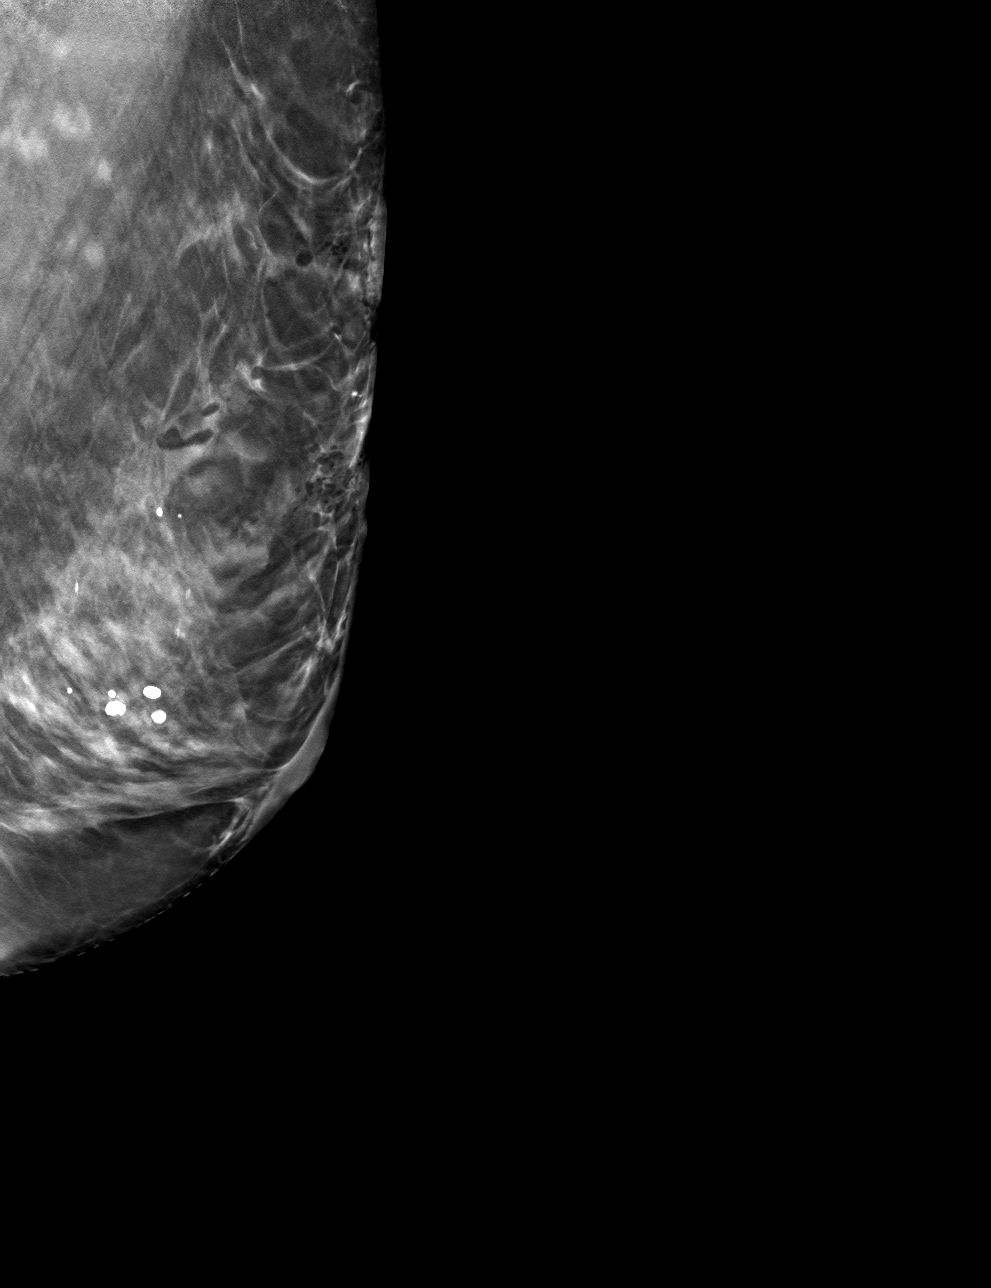

[4 of 12 positions shown; findings below may reference images not displayed]

FINDINGS: 3D Mammographic images were obtained following stereotactic guided
biopsy of posterior calcifications in the UPPER OUTER QUADRANT of
the LEFT breast and placement of an X shaped clip. The biopsy
marking clip is in expected position at the site of biopsy.

Following biopsy of the more anterior calcifications in the
UPPER-OUTER QUADRANT of the LEFT breast, a coil shaped clip was
placed. The biopsy marking clip is in expected location at the site
of the biopsy.

The tissue marker clips are 2.2 centimeters apart on the
craniocaudal projection. However, calcifications extend anterior and
posterior to the tissue marker clips, at least 4.5 centimeters
total, as measured on the true LATERAL projection.
IMPRESSION: Tissue marker clips are in the expected locations after biopsy.

Final Assessment: Post Procedure Mammograms for Marker Placement

## 2022-12-08 ENCOUNTER — Inpatient Hospital Stay
Admit: 2022-12-08 | Discharge: 2022-12-08 | Disposition: A | Discharge status: Home / Self Care | Payer: MEDICAID | Attending: Emergency Medicine

## 2022-12-08 DIAGNOSIS — B028 Zoster with other complications: Secondary | ICD-10-CM

## 2022-12-08 LAB — CBC WITH AUTO DIFFERENTIAL
Basophils %: 0.9 %
Basophils Absolute: 0.1 10*3/uL (ref 0.0–0.2)
Eosinophils %: 5.8 %
Eosinophils Absolute: 0.4 10*3/uL (ref 0.0–0.6)
Hematocrit: 40.3 % (ref 36.0–48.0)
Hemoglobin: 13.6 g/dL (ref 12.0–16.0)
Lymphocytes %: 22.3 %
Lymphocytes Absolute: 1.7 10*3/uL (ref 1.0–5.1)
MCH: 30.8 pg (ref 26.0–34.0)
MCHC: 33.6 g/dL (ref 31.0–36.0)
MCV: 91.7 fL (ref 80.0–100.0)
MPV: 7 fL (ref 5.0–10.5)
Monocytes %: 10.6 %
Monocytes Absolute: 0.8 10*3/uL (ref 0.0–1.3)
Neutrophils %: 60.4 %
Neutrophils Absolute: 4.5 10*3/uL (ref 1.7–7.7)
Platelets: 304 10*3/uL (ref 135–450)
RBC: 4.4 M/uL (ref 4.00–5.20)
RDW: 14 % (ref 12.4–15.4)
WBC: 7.5 10*3/uL (ref 4.0–11.0)

## 2022-12-08 LAB — COMPREHENSIVE METABOLIC PANEL W/ REFLEX TO MG FOR LOW K
ALT: 10 U/L (ref 10–40)
AST: 16 U/L (ref 15–37)
Albumin/Globulin Ratio: 1.4 (ref 1.1–2.2)
Albumin: 4.6 g/dL (ref 3.4–5.0)
Alkaline Phosphatase: 68 U/L (ref 40–129)
Anion Gap: 13 (ref 3–16)
BUN: 20 mg/dL (ref 7–20)
CO2: 23 mmol/L (ref 21–32)
Calcium: 9.5 mg/dL (ref 8.3–10.6)
Chloride: 101 mmol/L (ref 99–110)
Creatinine: 0.8 mg/dL (ref 0.6–1.2)
Est, Glom Filt Rate: 82 (ref >60)
Glucose: 100 mg/dL — ABNORMAL HIGH (ref 70–99)
Potassium reflex Magnesium: 4.2 mmol/L (ref 3.5–5.1)
Sodium: 137 mmol/L (ref 136–145)
Total Bilirubin: 0.4 mg/dL (ref 0.0–1.0)
Total Protein: 7.8 g/dL (ref 6.4–8.2)

## 2022-12-08 LAB — URINALYSIS WITH REFLEX TO CULTURE
Bilirubin, Urine: NEGATIVE
Blood, Urine: NEGATIVE
Glucose, Ur: NEGATIVE mg/dL
Ketones, Urine: NEGATIVE mg/dL
Leukocyte Esterase, Urine: NEGATIVE
Nitrite, Urine: NEGATIVE
Protein, UA: NEGATIVE mg/dL
Specific Gravity, UA: 1.01 (ref 1.005–1.030)
Urobilinogen, Urine: 0.2 E.U./dL (ref <2.0)
pH, Urine: 6.5 (ref 5.0–8.0)

## 2022-12-08 LAB — LIPASE: Lipase: 36 U/L (ref 13.0–60.0)

## 2022-12-08 MED ORDER — PREDNISONE 20 MG PO TABS
20 MG | ORAL_TABLET | ORAL | 0 refills | Status: AC
Start: 2022-12-08 — End: 2022-12-18

## 2022-12-08 MED ORDER — HYDROCODONE-ACETAMINOPHEN 5-325 MG PO TABS
5-325 MG | ORAL_TABLET | Freq: Three times a day (TID) | ORAL | 0 refills | Status: AC | PRN
Start: 2022-12-08 — End: 2022-12-11

## 2022-12-08 MED ORDER — VALACYCLOVIR HCL 1 G PO TABS
1 g | ORAL_TABLET | Freq: Three times a day (TID) | ORAL | 0 refills | Status: AC
Start: 2022-12-08 — End: 2022-12-15

## 2022-12-08 MED ORDER — KETOROLAC TROMETHAMINE 15 MG/ML IJ SOLN
15 | Freq: Once | INTRAMUSCULAR | Status: AC
Start: 2022-12-08 — End: 2022-12-08
  Administered 2022-12-08: 11:00:00 15 mg via INTRAVENOUS

## 2022-12-08 MED FILL — KETOROLAC TROMETHAMINE 15 MG/ML IJ SOLN: 15 MG/ML | INTRAMUSCULAR | Qty: 1

## 2022-12-08 NOTE — ED Provider Notes (Signed)
[facility] Emergency Department      Pt Name: Beth Acosta  MRN: [id number]  Birthdate 11/07/1960  Date of evaluation: 12/08/2022  Provider: Sidonie Dickens, MD  CHIEF COMPLAINT  Chief Complaint   Patient presents with    Rash     Pt came in from home, pt reports getting a possible bug bite on Tuesday and was seen at urgent care  and was told she had cellulitius . The bumps has now turned into a painful rash across her back, pt also reports abdominal cramps with the rash.     HPI  Beth Acosta is a 62 y.o. female who presents because of flank pain.  He thought that she got bit by a bug after walking the dog on Tuesday.  She developed a rash on her left buttock.  She went to an urgent care and was told that she has cellulitis.  At that time, she also had a rash along the left lateral lower part of her leg and she was told that she has eczema also.  She has been taking doxycycline and Bactrim.  She started developing abdominal cramps a few days ago.  She is not really having diarrhea.  She read that this could be a side effect to the medication.  She denies any dysuria.  Denies any nausea or vomiting.  She did have chickenpox when she was younger.  Denies any vomiting.    REVIEW OF SYSTEMS:  No fever, no chest pain, no dysuria Pertinent positives and negatives as per the HPI.  All other pertinent review of systems reviewed and negative.  Nursing notes reviewed.    PAST MEDICAL HISTORY  History reviewed. No pertinent past medical history.  SURGICAL HISTORY  History reviewed. No pertinent surgical history.  MEDICATIONS:  No current facility-administered medications on file prior to encounter.     No current outpatient medications on file prior to encounter.     ALLERGIES  Carbamazepine  FAMILY HISTORY:  History reviewed. No pertinent family history.  SOCIAL HISTORY:  Social History     Tobacco Use    Smoking status: Never    Smokeless tobacco: Never     IMMUNIZATIONS:  Noncontributory    PHYSICAL EXAM  VITAL  SIGNS:  Blood pressure 126/72, pulse 86, temperature 98.1 F (36.7 C), resp. rate 18, height 1.626 m (5\' 4" ), weight 58.5 kg (129 lb), SpO2 100 %.  Constitutional:  62 y.o. female alert, cooperative  HENT:  Atraumatic, mucous membranes moist  Eyes:   Conjunctiva clear, no icterus  Neck:  Supple, no adenopathy, no JVD  Cardiovascular:  Regular, no rubs  Thorax & Lungs:  No accessory muscle usage, clear  Abdomen:  Soft, non distended, no pulsatility or bruits, bowel sounds present, no focal tenderness  Back:  No deformity, no bruising, CVA tenderness, there is a vesicular rash present in the left lower back across the top of her buttock, some erythema below the buttock which is clustered, left calf mild skin redness as well as some redness of the posterior calf of her right leg  Genitalia:  No groin swelling  Rectal:  Deferred  Extremities:  No cyanosis, no edema, capillary refill distally intact  Skin:  Warm, dry  Neurologic:  Alert, no slurred speech, no focal deficits, coordination of extremities normal  Psychiatric:  Affect appropriate    RESULTS / MEDICAL DECISION MAKING:  Labs resulted at the time of this note reviewed.  Labs Reviewed   COMPREHENSIVE METABOLIC PANEL W/ REFLEX  TO MG FOR LOW K - Abnormal; Notable for the following components:       Result Value    Glucose 100 (*)     All other components within normal limits   CBC WITH AUTO DIFFERENTIAL   LIPASE   URINALYSIS WITH REFLEX TO CULTURE     RADIOLOGY:  None     ED COURSE:    Medications   ketorolac (TORADOL) injection 15 mg (15 mg IntraVENous Given 12/08/22 0641)     History from:  Patient  Limitations to history:  None  Chronic Conditions:  has no past medical history on file.  Records Reviewed:  Outpatient notes  Disposition Considerations (Tests not ordered but considered, Shared Decision Making, Pt Expectation of Test or Tx.):  MR imaging not deemed clinically necessary in the ED setting    PROCEDURES:  None    CRITICAL CARE:  None    CONSULTATIONS:   None    Beth Acosta is a 62 y.o. female who presented because of flank pain.  She does have a rash that looks consistent with shingles on her left side.  She also has some spots on the right side that make the clinical appearance a little bit unusual though she does say that she has sensitive skin and has had eczema before.  Based on history, physical exam, and testing my suspicion is low for bowel or biliary obstruction, cholangitis, perforated viscous, appendicitis, torsion, vascular occlusion or dissection, ACS, PE, amongst other emergent conditions.   Beth Acosta was given appropriate discharge instructions.  Referral to follow up provider.    New Prescriptions    PREDNISONE (DELTASONE) 20 MG TABLET    40 mg po x 3 days then   20 mg po x 3 days then  10 mg po x 3 days for a total of 9 days, please dispense QS    VALACYCLOVIR (VALTREX) 1 G TABLET    Take 1 tablet by mouth 3 times daily for 7 days     FOLLOW UP:    your doctor    Schedule an appointment as soon as possible for a visit       [facility] Emergency Department  [address]  [place] [address]  [phone]  Go to   If symptoms worsen    FINAL IMPRESSION:    1. Herpes zoster with other complication    2. Abdominal pain in female patient        (Please note that I used voice recognition software to generate this note.  Occasionally words are mistranscribed despite my efforts to edit errors.Riesa Pope, MD  12/08/22 [phone]

## 2022-12-12 ENCOUNTER — Encounter: Payer: MEDICAID | Attending: Family | Primary: Student in an Organized Health Care Education/Training Program

## 2022-12-12 ENCOUNTER — Ambulatory Visit
Admit: 2022-12-12 | Discharge: 2022-12-12 | Payer: MEDICAID | Attending: Family | Primary: Student in an Organized Health Care Education/Training Program

## 2022-12-12 DIAGNOSIS — B028 Zoster with other complications: Secondary | ICD-10-CM

## 2022-12-12 LAB — C-REACTIVE PROTEIN: CRP: <3 mg/L (ref 0.0–5.1)

## 2022-12-12 MED ORDER — VALACYCLOVIR HCL 1 G PO TABS
1 g | ORAL_TABLET | Freq: Three times a day (TID) | ORAL | 0 refills | Status: AC
Start: 2022-12-12 — End: 2022-12-19

## 2022-12-12 NOTE — Patient Instructions (Signed)
Heating pad to low back and hip for 20 minutes  Drink plenty of water  Take Valtrex with food  If areas start to weep you will be contagious

## 2022-12-12 NOTE — Progress Notes (Signed)
Beth Acosta (DOB:  December 14, 1960) is a 62 y.o. female,New patient, here for evaluation of the following chief complaint(s):  Established New Doctor and Follow-up (shingles)      Assessment & Plan     Vedia was seen today for established new doctor and follow-up.    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       Subjective   HPI presents today to establish care, states last Monday approximately 1 week ago she developed pain in her left side followed by breakout of bumps on her left side that eventually went to her lower back.  The pain is pulling and sharp at intervals somewhat sensitive to touch also is erythematous spots blisters on her lumbar area.  No drainage noted.  Went to urgent care and was emergency room given prednisone and valacyclovir the pain is very so bad however she refuses to take the pain medicine except her sleep.    Review of Systems   Constitutional:  Positive for fatigue.   HENT: Negative.     Eyes: Negative.    Respiratory: Negative.     Cardiovascular: Negative.    Gastrointestinal: Negative.    Endocrine: Negative.    Genitourinary: Negative.    Musculoskeletal:  Positive for back pain.   Skin:  Positive for rash.   Neurological: Negative.    Hematological: Negative.    Psychiatric/Behavioral: Negative.       Vitals:    12/12/22 0952   BP: 110/60   Pulse: 88   SpO2: 98%      BP Readings from Last 3 Encounters:   12/12/22 110/60   12/08/22 126/72      Wt  Readings from Last 3 Encounters:   12/12/22 60.8 kg (134 lb)   12/08/22 58.5 kg (129 lb)         Objective   Physical Exam  Constitutional:       Appearance: Normal appearance. She is ill-appearing.   Cardiovascular:      Rate and Rhythm: Normal rate and regular rhythm.   Pulmonary:      Effort: Pulmonary effort is normal.      Breath sounds: Normal breath sounds.   Skin:     General: Skin is warm.      Findings: Erythema, lesion and rash present. Rash is papular, pustular and urticarial.             Comments: Area noted on her left lower lumbar spine with pustular areas noted and erythremia, also proceeds to her groin area.  And her left thigh.  Painful with movement and without touch and with touch.  Today area from her mid low back to her hip.  It is sore.   Neurological:      Mental Status: She is alert.  An electronic signature was used to authenticate this note.    --Rocky Link, APRN - CNP

## 2022-12-13 LAB — VARICELLA ZOSTER ANTIBODY, IGG: Varicella-Zoster Virus Ab, Igg: IMMUNE

## 2022-12-13 LAB — HEMOGLOBIN A1C
Estimated Avg Glucose: 114 mg/dL
Hemoglobin A1C: 5.6 %

## 2023-01-16 ENCOUNTER — Encounter: Payer: MEDICAID | Attending: Family | Primary: Student in an Organized Health Care Education/Training Program

## 2023-01-17 ENCOUNTER — Encounter: Payer: MEDICAID | Attending: Family | Primary: Student in an Organized Health Care Education/Training Program

## 2023-02-01 ENCOUNTER — Ambulatory Visit
Admit: 2023-02-01 | Discharge: 2023-02-01 | Payer: MEDICAID | Attending: Student in an Organized Health Care Education/Training Program | Primary: Student in an Organized Health Care Education/Training Program

## 2023-02-01 ENCOUNTER — Encounter

## 2023-02-01 DIAGNOSIS — Z1231 Encounter for screening mammogram for malignant neoplasm of breast: Secondary | ICD-10-CM

## 2023-02-01 DIAGNOSIS — Z0000 Encounter for general adult medical examination without abnormal findings: Secondary | ICD-10-CM

## 2023-02-01 NOTE — Progress Notes (Signed)
Beth Acosta is a 62 y.o. year old female here for:    Chief Complaint:    Chief Complaint   Patient presents with    New Patient     Pt is establishing care. F/u on shingles went [facility] ER     Annual Exam       Subjective:         HPI:     Patient presenting to establish care.  No significant concerns today.  Did recently have shingles but is more or less recovered from this.  She did have several sequelae from the shingles infection.  Does not take any medications and feels well overall.  She recently moved to the area and needs to establish with a dentist and optometrist.  Not up-to-date on mammography.  Up-to-date on colonoscopy.    Past Medical History:   Diagnosis Date    Migraines      Social History     Tobacco Use    Smoking status: Never     Passive exposure: Never    Smokeless tobacco: Never   Vaping Use    Vaping Use: Never used   Substance Use Topics    Alcohol use: Yes     Alcohol/week: 1.0 standard drink of alcohol     Types: 1 Glasses of wine per week     Comment: socially    Drug use: Never     Family History   Problem Relation Age of Onset    Dementia Mother     Breast Cancer Mother     Hypertension Father     Heart Failure Father     Lung Cancer Father     Prostate Cancer Father     Thyroid Disease Sister     Tremors Sister     Stroke Brother     Hypertension Brother     No Known Problems Brother     No Known Problems Maternal Grandmother     No Known Problems Maternal Grandfather     Cancer Paternal Grandmother         breast    Heart Failure Paternal Grandfather     Parkinson's Disease Paternal Uncle      Past Surgical History:   Procedure Laterality Date    ARM SURGERY Left 2012    BRAIN SURGERY  2021    BREAST BIOPSY Left 2022    double needle    BREAST LUMPECTOMY W/ NEEDLE LOCALIZATION Right 2006    CARPAL TUNNEL RELEASE Left 2012    FINGER TRIGGER RELEASE Right 2012       No current outpatient medications on file.  Allergies   Allergen Reactions    Carbamazepine        Review of  Systems:  Review of Systems   Constitutional:  Negative for fatigue and fever.   HENT:  Negative for congestion.    Respiratory:  Negative for cough and shortness of breath.    Cardiovascular:  Negative for leg swelling.   Gastrointestinal:  Negative for constipation and diarrhea.   Genitourinary:  Negative for dysuria.   Neurological:  Negative for headaches.   Psychiatric/Behavioral:  Negative for sleep disturbance. The patient is not nervous/anxious.        Objective:    Physical Exam:  Vitals:    02/01/23 1334   BP: 120/76   Pulse: 96   SpO2: 97%   Weight: 61.8 kg (136 lb 3.2 oz)   Height: 1.626 m (5\' 4" )  Physical Exam  Vitals reviewed.   Constitutional:       Appearance: Normal appearance. She is normal weight.   HENT:      Head: Normocephalic and atraumatic.      Right Ear: Tympanic membrane, ear canal and external ear normal.      Left Ear: Tympanic membrane, ear canal and external ear normal.      Nose: Nose normal.      Mouth/Throat:      Mouth: Mucous membranes are moist.      Pharynx: Oropharynx is clear.   Eyes:      Extraocular Movements: Extraocular movements intact.      Conjunctiva/sclera: Conjunctivae normal.   Cardiovascular:      Rate and Rhythm: Normal rate and regular rhythm.      Pulses: Normal pulses.      Heart sounds: Normal heart sounds.   Pulmonary:      Effort: Pulmonary effort is normal.      Breath sounds: Normal breath sounds.   Abdominal:      General: Abdomen is flat. Bowel sounds are normal.      Palpations: Abdomen is soft.   Musculoskeletal:      Cervical back: Neck supple.      Right lower leg: No edema.      Left lower leg: No edema.   Lymphadenopathy:      Cervical: No cervical adenopathy.   Skin:     General: Skin is warm and dry.      Capillary Refill: Capillary refill takes less than 2 seconds.      Findings: No rash.   Neurological:      General: No focal deficit present.      Mental Status: She is alert and oriented to person, place, and time. Mental status is at  baseline.   Psychiatric:         Mood and Affect: Mood normal.         Behavior: Behavior normal.         Thought Content: Thought content normal.         Judgment: Judgment normal.         Body mass index is 23.38 kg/m.    Labs:  Recent Results (from the past 672 hour(s))   LIPID PANEL    Collection Time: 02/01/23  2:31 PM   Result Value Ref Range    Cholesterol, Total 290 (H) 0 - 199 mg/dL    Triglycerides 57 0 - 150 mg/dL    HDL 95 (H) 40 - 60 mg/dL    LDL Cholesterol 454 (H) <100 mg/dL    VLDL Cholesterol Calculated 11 Not Established mg/dL       Imaging:  MAM DIGITAL SCREEN W OR WO CAD BILATERAL    (Results Pending)         ASSESSMENT & PLAN:    Beth Acosta is a 62 y.o. year old female here for New Patient (Pt is establishing care. F/u on shingles went [facility] ER ) and Annual Exam  .The following is a summary of the plan made at this visit:    1. Annual physical exam  -     LIPID PANEL; Future  -     [facility] - Bisig, Kristen, OD, Optometry, [place]  2. Encounter for screening mammogram for malignant neoplasm of breast  -     MAM DIGITAL SCREEN W OR WO CAD BILATERAL; Future      Reviewed all pertinent previous records,  including lab work and imaging.    Physical exam and vital signs reassuring.  Will obtain lab work and mammography as above.  Refer to optometry.  Encouraged her to establish with a dentist at her earliest convenience.  Assuming normal labs and no further needs, plan to follow yearly.    Encouraged patient to call back with any question/concerns.  Return/ED precautions discussed, all questions/concerns answered and patient verbalized understanding/approval of treatment plan.   Rozanna Box, DO    This note was generated completely or in part utilizing Dragon dictation speech recognition software.  Occasionally, words are mistranscribed and despite editing, the text may contain inaccuracies due to incorrect word recognition.  If further clarification is needed please contact the office  at [phone].

## 2023-02-02 LAB — LIPID PANEL
Cholesterol, Total: 290 mg/dL — ABNORMAL HIGH (ref 0–199)
HDL: 95 mg/dL — ABNORMAL HIGH (ref 40–60)
LDL Cholesterol: 184 mg/dL — ABNORMAL HIGH (ref <100)
Triglycerides: 57 mg/dL (ref 0–150)
VLDL Cholesterol Calculated: 11 mg/dL

## 2023-02-02 NOTE — Telephone Encounter (Signed)
[facility] called and Maggie said that [facility] doesn't see patient with routine eye exams     They also do not take patient insurance     Please call maggie with any questions [phone]

## 2023-02-16 ENCOUNTER — Ambulatory Visit: Payer: MEDICAID | Primary: Student in an Organized Health Care Education/Training Program

## 2023-02-16 DIAGNOSIS — Z1231 Encounter for screening mammogram for malignant neoplasm of breast: Secondary | ICD-10-CM

## 2023-10-27 ENCOUNTER — Ambulatory Visit
Admit: 2023-10-27 | Discharge: 2023-10-27 | Payer: Medicare (Managed Care) | Attending: Family Medicine | Primary: Student in an Organized Health Care Education/Training Program

## 2023-10-27 VITALS — BP 106/68 | HR 79 | Wt 128.0 lb

## 2023-10-27 DIAGNOSIS — M62830 Muscle spasm of back: Secondary | ICD-10-CM

## 2023-10-27 LAB — POCT URINALYSIS DIPSTICK
Bilirubin, UA: NEGATIVE
Glucose, UA POC: NEGATIVE mg/dL
Leukocytes, UA: NEGATIVE
Nitrite, UA: NEGATIVE
Protein, UA POC: NEGATIVE mg/dL
Spec Grav, UA: 1.02
Urobilinogen, UA: 0.2 mg/dL
pH, UA: 6

## 2023-10-27 MED ORDER — PREDNISONE 20 MG PO TABS
20 | ORAL_TABLET | Freq: Two times a day (BID) | ORAL | 0 refills | 6.00000 days | Status: AC
Start: 2023-10-27 — End: 2023-11-01

## 2023-10-27 MED ORDER — TIZANIDINE HCL 4 MG PO TABS
4 | ORAL_TABLET | Freq: Three times a day (TID) | ORAL | 0 refills | 30.00000 days | Status: DC | PRN
Start: 2023-10-27 — End: 2024-03-06

## 2023-10-27 NOTE — Progress Notes (Signed)
 Beth Acosta is a 63 y.o. female.    HPI:  Accompanied by her husband  History of back pain off and on  Does well with therapy  Walks her dog, mows her lawn  After lawnmowing had increased low back pain with left sided radiating pain down her buttock and leg  Reluctant to take medication  Recommend she alternate Tylenol  with Advil  Alternate heat and Salonpas  Recommend physical therapy also, she is willing    Meds, vitamins and allergies reviewed with pt    Wt Readings from Last 3 Encounters:   10/27/23 58.1 kg (128 lb)   02/16/23 61.2 kg (135 lb)   02/01/23 61.8 kg (136 lb 3.2 oz)       REVIEW OF SYSTEMS:   CONSTITUTIONAL: See history of present illness,   Weight noted   HEENT: No new vision difficulties or ringing in the ears.   RESPIRATORY: No new SOB, PND, orthopnea or cough.   CARDIOVASCULAR: no CP, palpitations or SOB with exertion  GI: No nausea, vomiting, diarrhea, constipation, abdominal pain or changes in bowel habits.   GU: No urinary frequency, urgency, incontinence hematuria or dysuria.   SKIN: No cyanosis or skin lesions.   MUSCULOSKELETAL: See HPI  NEUROLOGICAL: No syncope or TIA-like symptoms.   PSYCHIATRIC: No anxiety, insomnia or depression     Allergies   Allergen Reactions    Carbamazepine        Prior to Visit Medications    Medication Sig Taking? Authorizing Provider   predniSONE  (DELTASONE ) 20 MG tablet Take 1 tablet by mouth 2 times daily for 5 days Yes English Craighead C, MD   tiZANidine (ZANAFLEX) 4 MG tablet Take 1 tablet by mouth every 8 hours as needed (muscle sapsm) Yes Kyser Wandel, Rosette Console, MD       Past Medical History:   Diagnosis Date    Breast cyst ?    Diffuse cystic mastopathy ?    Migraines        Social History     Tobacco Use    Smoking status: Never     Passive exposure: Never    Smokeless tobacco: Never   Substance Use Topics    Alcohol use: Yes     Alcohol/week: 1.0 standard drink of alcohol     Types: 1 Glasses of wine per week     Comment: socially       Family History    Problem Relation Age of Onset    Dementia Mother     Breast Cancer Mother 51    Hypertension Father     Heart Failure Father     Lung Cancer Father     Prostate Cancer Father     Thyroid Disease Sister     Tremors Sister     Stroke Brother     Hypertension Brother     No Known Problems Brother     No Known Problems Maternal Grandmother     No Known Problems Maternal Grandfather     Cancer Paternal Grandmother         breast    Heart Failure Paternal Grandfather     Parkinson's Disease Paternal Uncle        OBJECTIVE:  BP 106/68   Pulse 79   Wt 58.1 kg (128 lb)   SpO2 99%   BMI 21.97 kg/m   GEN: Complaining of left-sided low back discomfort, sciatica somewhat improved but still present  HEENT:  NCAT  NECK:  Supple without  adenopathy.  CV:  Regular rate and rhythm, S1 and S2 normal, no murmurs, clicks  PULM:  Chest is clear, no wheezing ,  symmetric air entry throughout both lung fields.  Back: Nontender over spine, paraspinal muscle tenderness to palpation and appears to be in spasm/tight compared to the right side  Fairly good range of motion if she goes slowly  ABD: Soft, NT, no masses appreciated  EXT: No rash or edema  NEURO: Alert oriented 3, negative straight leg raise on the right, muscles feel tight on the left with straight leg raise    ASSESSMENT/PLAN:  1. Spasm of muscle of lower back  Alternate Tylenol  with Advil  Alternate heat with Salonpas  Refer to therapy  Tizanidine every 8 hours as needed for spasm  - tiZANidine (ZANAFLEX) 4 MG tablet; Take 1 tablet by mouth every 8 hours as needed (muscle sapsm)  Dispense: 15 tablet; Refill: 0  - [facility]    2. Strain of lumbar region, initial encounter  Prednisone  daily with food  Refer to physical therapy  - POCT Urinalysis no Micro  - predniSONE  (DELTASONE ) 20 MG tablet; Take 1 tablet by mouth 2 times daily for 5 days  Dispense: 10 tablet; Refill: 0  - [facility]    3. Elevated LDL  cholesterol level  Made her aware of elevated LDL cholesterol  Will follow-up with Dr. Linnell Richardson and recheck  Consider CT calcium score as she is resistant to statin therapy      25 Total Minutes spent pre charting (reviewing problem list, meds, any test results, consultant and hospital notes ) and  obtaining present visit history, performing appropriate medical exam/evaluation, counseling and educating the patient (and family), ordering medications ,tests, and procedures as needed, refilling medication(s), placing referral(s) when needed in addition to coordinating care for this patient and documenting in electronic health record

## 2023-10-31 ENCOUNTER — Inpatient Hospital Stay
Admit: 2023-10-31 | Discharge: 2023-10-31 | Payer: Medicare (Managed Care) | Primary: Student in an Organized Health Care Education/Training Program

## 2023-10-31 DIAGNOSIS — M62830 Muscle spasm of back: Secondary | ICD-10-CM

## 2023-10-31 NOTE — Plan of Care (Signed)
 [facility] - Outpatient Rehabilitation and Therapy: [address], [place], [place] [address] office: [phone] fax: [phone]     Physical Therapy Initial Evaluation Certification      Dear Erin C Davlin, MD ,    We had the pleasure of evaluating the following patient for physical therapy services at [facility].  A summary of our findings can be found in the initial assessment below.  This includes our plan of care.  If you have any questions or concerns regarding these findings, please do not hesitate to contact me at the office phone number listed above.  Thank you for the referral.     Physician Signature:_______________________________Date:__________________  By signing above (or electronic signature), therapist's plan is approved by physician       Physical Therapy: TREATMENT/PROGRESS NOTE   Patient: Beth Acosta (63 y.o. female)   Examination Date: 10/31/2023   DOB:  December 15, 1960 MRN: [id number]   Visit #: 1   Insurance Allowable Auth Needed    [] Yes    [] No    Insurance: Payor: MEDIGOLD / Plan: MEDIGOLD / Product Type: *No Product type* /   Insurance ID: [id number] - (Medicare Managed)  Secondary Insurance (if applicable):    Treatment Diagnosis:   No diagnosis found.   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    Preferred Language for Healthcare:   [x]  English       []  other:    SUBJECTIVE EXAMINATION     Patient stated complaint: Pt referred by PCP for low back pain. She has a history of off and on back pain. Has had physical therapy in the past.  After lawnmowing, had increased low back pain with left sided radiating pain down her buttock and leg. Reluctant to take medication. Is on prednisone  and feels better now, haven't taken many muscle relaxers. Have had back issues since she was in her 63's.  Says she was diagnosed with degenerative arthritis in the past, in 2004. She is active. Does well with heat. Doesn't have any specific area that bothers  her. She is very familiar with her back symptoms, and over the years has had some problems with sciatica.         Test used Initial score  10/31/23 10/31/2023   Pain Summary VAS 2-6    Functional questionnaire Modified Oswestry Not completed     Other:              Pain:  Pain location: low back  Patient describes pain to be dull and aching  Pain decreases with: Sitting, Resting, and Medication  Pain increases with:  n/a, occasionally with positional changes     Living status: 2 story home    Current Functional Limitations:    Functional Complaints:        PLOF:  No functional limitations  Pt's sleep is affected?   NO    Occupation/School:  Work/School Status: Retired  Environmental education officer Duties/Demands: NA    Scientist, research (medical) Hobbies:  walks dog, Publishing rights manager with a pushmower 1/3 acre.     Review Of Systems (ROS):  [x]  Performed Review of systems (Integumentary, CardioPulmonary, Neurological) by intake and observation. Intake form is in the medical record. Patient has been instructed to contact their primary care physician  regarding ROS issues if not already being addressed at this time.    [x]  Patient history, allergies, meds reviewed. Medical chart reviewed. See intake form.     OBJECTIVE EXAMINATION     10/31/23  ROM/Strength: (Blank cells denote NT)       Mvmt (norm) AROM L AROM R Notes PROM L PROM R Notes     LUMBAR Flex (90) 90        Ext (25) 25 Mild left lumbar pain        SB (25) 25 20        Rotation (30)             HIP Flex (120) 130 130        Abd (45)          ER (50)          IR (45)          Ext (20)         KNEE Flex (140)          Ext (0)           ANKLE DF (20)          PF (50)          Inversion (30)          Eversion (20)             MMT L          MMT  R Notes     LUMBAR  Flexion       Extension       Lateral flexion        Rotation          MMT L MMT R Notes       HIP  Flexion 5 5      Abduction 5 5      ER 4+ 5      IR 4 5      Extension 4+ 4+    KNEE  Flexion 5 5      Extension 5 5      ANKLE  DF 5 5      PF        Inversion        Eversion  Repeated Movements: []  Normal  []  Abnormal []  N/A    Palpation:   Unremarkable  Location:    Posture:   decreased lumbar lordosis    Bandages/Dressings/Incisions:  Not Applicable    Dermatomes: Abnormal findings listed below  All WNL    Myotomes: Abnormal findings listed below  All WNL    Reflexes: Abnormal findings listed below  All reflexes WNL (2+)    Specific Joint Mobility Testing/Accessory Motions:      Thoracic: WNL  Lumbar: WNL    Special Tests:  fwd bend- aberrant or innominate mvmt Negative  Kemps/quadrant Negative  Faber Test positive left  SLR Negative and Slump test Negative    Gait:    Pattern: WNL  Assistive Device Used: no AD    Balance:  [x]  WNL      []  NT       []  Dysfunction noted  Comment:     Falls Risk Assessment (30 days):   Falls Risk assessed and no intervention required.  Time Up and Go (TUG):   Not Assessed        Exercises/Interventions     Therapeutic Ex ([address])  resistance Sets/time Reps Notes/Cues/Progressions   HEP  DISCUSSION/DEMONSTRATION  15 MINS                                                                     Manual Intervention (97140)  TIME                                        NMR re-education (60454) resistance Sets/time Reps CUES NEEDED                                      Therapeutic Activity (97530)  Sets/time                                          Modalities:    No modalities applied this session    Education/Home Exercise Program: Patient HEP program created electronically.  Refer to Medbridge access code:      Access Code: [email or web address]  URL: [email or web address]  Date: 10/31/2023  Prepared by: Sherell Dill    Exercises  - Supine Piriformis Stretch with Foot on Ground  - 2 x daily - 7 x weekly - 1 sets - 3 reps - 20 secs hold  - Supine Bridge  - 2 x daily - 7 x weekly - 2-3 sets - 10 reps  - Sidelying Open Book Thoracic Lumbar Rotation and Extension  - 2 x daily - 7 x weekly - 2-3 sets - 10 reps  - Bird Dog  - 2 x daily - 7 x weekly - 2-3 sets - 10 reps  - Quadruped Full Range Thoracic Rotation with Reach  - 2 x daily - 7 x weekly - 2-3 sets - 10 reps  - Cat Cow to Child's Pose  - 2 x daily - 7 x weekly - 2-3 sets - 10 reps  ASSESSMENT   Assessment:   Beth Acosta is a 63 y.o. female presenting today to Outpatient PT with signs and symptoms consistent with .    Pt. presents with the functional impairments and activity limitations listed below and would benefit from Outpatient PT to address the below impairments as well as improve pain, and restore function.     Functional Impairments:   Intermittent pain    Classification :   Signs/symptoms consistent with Lumbar direction specific/centralization subgroup       Barriers to/and or personal factors that will affect rehab potential:   None noted    Physical Therapy Evaluation Complexity Justification  [x]  A history of present problem and no personal factors and/or co-morbidities that impact the plan of care  [x]  A total of 1-2 elements  found  upon examination of body systems using standardized tests and measures addressing any of the following: body structures, functions (impairments), activity limitations, and/or participation restrictions  [x]  A clinical presentation with stable and/or uncomplicated characteristics   [x]  Clinical decision making of LOW (97161 - Typically 20 minutes face-to-face) complexity using standardized patient assessment instrument and/or measurable assessment of functional outcome.    Today's Assessment: See above    Medical Necessity Documentation:  I certify that this patient meets the below criteria necessary for medical necessity for care and/or justification of therapy services:  The patient has functional impairments and/or activity limitations and would benefit from continued outpatient therapy services to address the deficits outlined in the patients goals  The patient has a musculoskeletal condition(s) with a corresponding ICD-10 code that is of complexity and severity that require skilled therapeutic intervention. This has a direct and significant impact on the need for therapy and significantly impacts the rate of recovery.     Return to Play: NA    Prognosis for POC: [x]  Good []  Fair  []  Poor    Patient requires continued skilled intervention: [x]  Yes  []  No      CHARGE CAPTURE     PT CHARGE GRID   CPT Code (TIMED) minutes # CPT Code (UNTIMED) #     Therex (97110)  15 1  EVAL:LOW (97161 - Typically 20 minutes face-to-face) 1    Neuromusc. Re-ed ([address])    Re-Eval (60454)     Manual (97140)    Estim Unattended (97014)     Ther. Act (09811) 10 1  Mech. Traction [phone])     Gait [phone])    Dry Needle 1-2 muscle (13086)     Aquatic Therex [id number])    Dry Needle 3+ muscle (96295)     Iontophoresis (28413)    VASO (24401)     Ultrasound (02725)    Group Therapy (36644)     Estim Attended (03474)    Canalith Repositioning (25956)     Physical Performance Test [phone])    Custom orthotic (E3329)     Other:    Other:    Total  Timed Code Tx Minutes 25 2  15      Total Treatment Minutes 40        Charge Justification:  (51884) THERAPEUTIC EXERCISE - Provided verbal/tactile cueing for HEP and/or activities related to strengthening, flexibility, endurance, ROM performed to prevent loss of range of motion, maintain or improve muscular strength or increase flexibility, following either an injury or surgery.   [phone]) THERAPEUTIC ACTIVITY - use of dynamic activities to improve functional performance. (Ex include squatting, ascending/descending stairs, walking, bending, lifting, catching, throwing, pushing, pulling, jumping.)  Direct, one on one contact, billed in 15-minute increments.    GOALS     Patient stated goal: improve tolerance with general household chores, walking   []  Progressing: []  Met: []  Not Met: []  Adjusted    Therapist goals for Patient:   Short Term Goals: To be achieved in: 1 visit  1Independent in HEP and progression per patient tolerance, in order to prevent re-injury.   []  Progressing: [x]  Met: []  Not Met: []  Adjusted    Overall Progression Towards Functional goals/ Treatment Progress Update:  []  Patient is progressing as expected towards functional goals listed.    []  Progression is slowed due to complexities/Impairments listed.  []  Progression has been slowed due to co-morbidities.  [x]  Plan just implemented, too soon (<30days) to assess goals progression   []  Goals require adjustment due to lack of progress  []  Patient is not progressing as expected and requires additional follow up with physician  []  Other:     TREATMENT PLAN     Frequency/Duration: 1x/week for 1 weeks for the following treatment interventions:    Interventions:  Therapeutic Exercise (97110) including: strength training, ROM, and functional mobility  Therapeutic Activities (97530) including: functional mobility training and education.    Plan:  No Further therapy needs indicated at this time. Pt will continue with HEP issued today and will f/u if  necessary at a later date.  Pt is comfortable with the plan.     Electronically Signed by Sherell Dill, PT  Date: 10/31/2023     Note: Portions of this note have been templated and/or copied from initial evaluation, reassessments and prior notes for documentation efficiency.    Note: If patient does not return for scheduled/recommended follow up visits, this note will serve as a discharge from care along with the most recent update on progress.

## 2024-02-17 ENCOUNTER — Inpatient Hospital Stay
Admit: 2024-02-17 | Payer: Medicare (Managed Care) | Primary: Student in an Organized Health Care Education/Training Program

## 2024-02-17 DIAGNOSIS — Z1231 Encounter for screening mammogram for malignant neoplasm of breast: Principal | ICD-10-CM

## 2024-02-20 ENCOUNTER — Encounter

## 2024-02-21 ENCOUNTER — Inpatient Hospital Stay
Admit: 2024-02-21 | Payer: Medicare (Managed Care) | Primary: Student in an Organized Health Care Education/Training Program

## 2024-02-21 DIAGNOSIS — R928 Other abnormal and inconclusive findings on diagnostic imaging of breast: Principal | ICD-10-CM

## 2024-03-05 NOTE — Progress Notes (Signed)
 I contacted the patient today with updated results.  Dr. Morene compared her recent mammogram with all of the priors that we just received, the area of calcifications left breast has been stable for many years so the patient does not need a 6 month follow up.  Routine screening mammogram in one year is now the recommendation.  The patient voiced understanding.

## 2024-03-06 ENCOUNTER — Encounter

## 2024-03-06 NOTE — Other (Signed)
 Pt.notified

## 2024-03-27 ENCOUNTER — Ambulatory Visit
Admit: 2024-03-27 | Discharge: 2024-03-27 | Payer: Medicare (Managed Care) | Attending: Student in an Organized Health Care Education/Training Program | Primary: Student in an Organized Health Care Education/Training Program

## 2024-03-27 VITALS — BP 112/78 | HR 83 | Temp 97.80000°F | Resp 16 | Ht 63.0 in | Wt 129.0 lb

## 2024-03-27 DIAGNOSIS — Z0000 Encounter for general adult medical examination without abnormal findings: Principal | ICD-10-CM

## 2024-03-27 NOTE — Patient Instructions (Addendum)
 Recommend 1,200mg  of calcium and 600 IU of vitamin D daily.    [employer]     [place] or [employer]     Learning About Vision Tests  What are vision tests?     The four most common vision tests are visual acuity tests, refraction, visual field tests, and color vision tests.  Visual acuity (sharpness) tests  These tests are used:  To see if you need glasses or contact lenses.  To monitor an eye problem.  To check an eye injury.  Visual acuity tests are done as part of routine exams. You may also have this test when you get your driver's license or apply for some types of jobs.  Visual field tests  These tests are used:  To check for vision loss in any area of your range of vision.  To screen for certain eye diseases.  To look for nerve damage after a stroke, head injury, or other problem that could reduce blood flow to the brain.  Refraction and color tests  A refraction test is done to find the right prescription for glasses and contact lenses.  A color vision test is done to check for color blindness.  Color vision is often tested as part of a routine exam. You may also have this test when you apply for a job where recognizing different colors is important, such as truck driving, Optician, dispensing, or the [employer].  How are vision tests done?  Visual acuity test   You cover one eye at a time.  You read aloud from a wall chart across the room.  You read aloud from a small card that you hold in your hand.  Refraction   You look into a special device.  The device puts lenses of different strengths in front of each eye to see how strong your glasses or contact lenses need to be.  Visual field tests   Your doctor may have you look through special machines.  Or your doctor may simply have you stare straight ahead while they move a finger into and out of your field of vision.  Color vision test   You look at pieces of printed test patterns in various colors. You say what number or symbol you see.  Your doctor  may have you trace the number or symbol using a pointer.  How do these tests feel?  There is very little chance of having a problem from this test. If dilating drops are used for a vision test, they may make the eyes sting and cause a medicine taste in the mouth.  Follow-up care is a key part of your treatment and safety. Be sure to make and go to all appointments, and call your doctor if you are having problems. It's also a good idea to know your test results and keep a list of the medicines you take.  Where can you learn more?  Go to [employer] and enter G551 to learn more about Learning About Vision Tests.  Current as of: February 01, 2023  Content Version: 14.6   2024-2025 [place], [place].   Care instructions adapted under license by [facility] If you have questions about a medical condition or this instruction, always ask your healthcare professional. Romayne Alderman, [facility], disclaims any warranty or liability for your use of this information.         A Healthy Heart: Care Instructions  Overview    Coronary artery disease, also called heart disease, occurs when  a substance called plaque builds up in the vessels that supply oxygen-rich blood to your heart muscle. This can narrow the blood vessels and reduce blood flow. A heart attack happens when blood flow is completely blocked. A high-fat diet, smoking, and other factors increase the risk of heart disease.  Your doctor has found that you have a chance of having heart disease. A heart-healthy lifestyle can help keep your heart healthy and prevent heart disease. This lifestyle includes eating healthy, being active, staying at a weight that's healthy for you, and not smoking, vaping, or using other tobacco or nicotine products. It also includes taking medicines as directed, managing other health conditions, and trying to get a healthy amount of sleep.  Follow-up care is a key part of your treatment and safety. Be sure to make  and go to all appointments, and contact your doctor if you are having problems. It's also a good idea to know your test results and keep a list of the medicines you take.  How can you care for yourself at home?  Diet  Use less salt when you cook and eat. This helps lower your blood pressure. Taste food before salting. Add only a little salt when you think you need it. With time, your taste buds will adjust to less salt.  Eat fewer snack items, fast foods, canned soups, and other high-salt, high-fat, processed foods.  Read food labels and try to avoid saturated and trans fats. They increase your risk of heart disease by raising cholesterol levels.  Limit the amount of solid fat--butter, margarine, and shortening--you eat. Use olive, peanut, or canola oil when you cook. Bake, broil, and steam foods instead of frying them.  Eat a variety of fruit and vegetables every day. Dark green, deep orange, red, or yellow fruits and vegetables are especially good for you. Examples include spinach, carrots, peaches, and berries.  Foods high in fiber can reduce your cholesterol and provide important vitamins and minerals. High-fiber foods include whole-grain cereals and breads, oatmeal, beans, brown rice, citrus fruits, and apples.  Eat lean proteins. Heart-healthy proteins include seafood, lean meats and poultry, eggs, beans, peas, nuts, seeds, and soy products.  Limit drinks and foods with added sugar. These include candy, desserts, and soda pop.  Heart-healthy lifestyle  If your doctor recommends it, get more exercise. For many people, walking is a good choice. Or you may want to swim, bike, or do other activities. Bit by bit, increase the time you're active every day. Try for at least 30 minutes on most days of the week.  If you smoke, vape, or use other tobacco or nicotine products, try to quit. If you can't quit, cut back as much as you can. If you need help quitting, talk to your doctor about quit programs and medicines.  Quitting is one of the most important things you can do to protect your heart. Also avoid secondhand smoke and the aerosol mist from vaping.  Stay at a weight that's healthy for you. Talk to your doctor if you need help losing weight.  Try to get 7 to 9 hours of sleep each night.  Limit alcohol to 2 drinks a day for men and 1 drink a day for women. Too much alcohol can cause health problems.  Manage other health problems such as diabetes, high blood pressure, and high cholesterol. If you think you may have a problem with alcohol or drug use, talk to your doctor.  Medicines  Take your medicines exactly  as prescribed. Contact your doctor if you think you are having a problem with your medicine.  When should you call for help?  Call 911 if you have symptoms of a heart attack. These may include:  Chest pain or pressure, or a strange feeling in the chest.  Sweating.  Shortness of breath.  Pain, pressure, or a strange feeling in the back, neck, jaw, or upper belly or in one or both shoulders or arms.  Lightheadedness or sudden weakness.  A fast or irregular heartbeat.  After you call 911, the operator may tell you to chew 1 adult-strength or 2 to 4 low-dose aspirin. Wait for an ambulance. Do not try to drive yourself.  Watch closely for changes in your health, and be sure to contact your doctor if you have any problems.  Where can you learn more?  Go to [employer] and enter F075 to learn more about A Healthy Heart: Care Instructions.  Current as of: February 01, 2023  Content Version: 14.6   2024-2025 [place], [place].   Care instructions adapted under license by [facility]. If you have questions about a medical condition or this instruction, always ask your healthcare professional. Romayne Alderman, [facility], disclaims any warranty or liability for your use of this information.    Personalized Preventive Plan for Beth Acosta - 03/27/2024  Medicare offers a range of preventive health  benefits. Some of the tests and screenings are paid in full while other may be subject to a deductible, co-insurance, and/or copay.  Some of these benefits include a comprehensive review of your medical history including lifestyle, illnesses that may run in your family, and various assessments and screenings as appropriate.  After reviewing your medical record and screening and assessments performed today your provider may have ordered immunizations, labs, imaging, and/or referrals for you.  A list of these orders (if applicable) as well as your Preventive Care list are included within your After Visit Summary for your review.

## 2024-03-27 NOTE — Progress Notes (Signed)
 Medicare Annual Wellness Visit    Beth Acosta is here for Medicare AWV (No concerns)    Assessment & Plan   Welcome to Medicare preventive visit  Elevated LDL cholesterol level  -     Comprehensive Metabolic Panel  -     LIPID PANEL  -     Hemoglobin A1C  -     CBC with Auto Differential  Abnormal finding of blood chemistry, unspecified  -     Hemoglobin A1C     Return in about 1 year (around 03/27/2025).     Subjective   The following acute and/or chronic problems were also addressed today:  NA    Patient's complete Health Risk Assessment and screening values have been reviewed and are found in Flowsheets. The following problems were reviewed today and where indicated follow up appointments were made and/or referrals ordered.    Positive Risk Factor Screenings with Interventions:                   Vision Screen:  Do you have difficulty driving, watching TV, or doing any of your daily activities because of your eyesight?: No  Have you had an eye exam within the past year?: (!) No  Interventions:   Patient encouraged to make appointment with their eye specialist                     Objective   Vitals:    03/27/24 1501   BP: 112/78   BP Site: Left Upper Arm   Patient Position: Sitting   BP Cuff Size: Medium Adult   Pulse: 83   Resp: 16   Temp: 97.8 F (36.6 C)   TempSrc: Temporal   SpO2: 100%   Weight: 58.5 kg (129 lb)   Height: 1.6 m (5' 3)      Body mass index is 22.85 kg/m.      General Appearance: alert and oriented to person, place and time, well developed and well- nourished, in no acute distress  Skin: warm and dry, no rash or erythema  Head: normocephalic and atraumatic  Eyes: pupils equal, round, and reactive to light, extraocular eye movements intact, conjunctivae normal  ENT: tympanic membrane, external ear and ear canal normal bilaterally, nose without deformity, nasal mucosa and turbinates normal without polyps  Neck: supple and non-tender without mass, no thyromegaly or thyroid nodules, no  cervical lymphadenopathy  Pulmonary/Chest: clear to auscultation bilaterally- no wheezes, rales or rhonchi, normal air movement, no respiratory distress  Cardiovascular: normal rate, regular rhythm, normal S1 and S2, no murmurs, rubs, clicks, or gallops, distal pulses intact, no carotid bruits  Abdomen: soft, non-tender, non-distended, normal bowel sounds, no masses or organomegaly  Extremities: no cyanosis, clubbing or edema  Musculoskeletal: normal range of motion, no joint swelling, deformity or tenderness  Neurologic: reflexes normal and symmetric, no cranial nerve deficit, gait, coordination and speech normal            Allergies   Allergen Reactions    Carbamazepine      Prior to Visit Medications   Not on File       CareTeam (Including outside providers/suppliers regularly involved in providing care):   Patient Care Team:  Rosemary Fairy SAUNDERS, DO as PCP - General (Family Medicine)  Rosemary Fairy SAUNDERS, DO as PCP - Empaneled Provider     Recommendations for Preventive Services Due: see orders and patient instructions/AVS.  Recommended screening schedule for the next 5-10 years is provided  to the patient in written form: see Patient Instructions/AVS.     Reviewed and updated this visit:  Tobacco  Allergies  Meds  Problems  Med Hx  Surg Hx  Fam Hx

## 2024-03-28 LAB — LIPID PANEL
Cholesterol, Total: 301 mg/dL — ABNORMAL HIGH (ref 0–199)
HDL: 89 mg/dL — ABNORMAL HIGH (ref 40–60)
LDL Cholesterol: 205 mg/dL — ABNORMAL HIGH (ref <100)
Triglycerides: 37 mg/dL (ref 0–150)
VLDL Cholesterol Calculated: 7 mg/dL

## 2024-03-28 LAB — COMPREHENSIVE METABOLIC PANEL
ALT: 13 U/L (ref 10–40)
AST: 25 U/L (ref 15–37)
Albumin/Globulin Ratio: 1.4 (ref 1.1–2.2)
Albumin: 4.4 g/dL (ref 3.4–5.0)
Alkaline Phosphatase: 60 U/L (ref 40–129)
Anion Gap: 11 (ref 3–16)
BUN: 19 mg/dL (ref 7–20)
CO2: 26 mmol/L (ref 21–32)
Calcium: 9.4 mg/dL (ref 8.3–10.6)
Chloride: 102 mmol/L (ref 99–110)
Creatinine: 0.7 mg/dL (ref 0.6–1.2)
Est, Glom Filt Rate: >90
Glucose: 93 mg/dL (ref 70–99)
Potassium: 4 mmol/L (ref 3.5–5.1)
Sodium: 139 mmol/L (ref 136–145)
Total Bilirubin: 0.5 mg/dL (ref 0.0–1.0)
Total Protein: 7.6 g/dL (ref 6.4–8.2)

## 2024-03-28 LAB — CBC WITH AUTO DIFFERENTIAL
Basophils %: 0.9 %
Basophils Absolute: 0.1 K/uL (ref 0.0–0.2)
Eosinophils %: 1 %
Eosinophils Absolute: 0.1 K/uL (ref 0.0–0.6)
Hematocrit: 40.2 % (ref 36.0–48.0)
Hemoglobin: 13.8 g/dL (ref 12.0–16.0)
Lymphocytes %: 24.4 %
Lymphocytes Absolute: 1.5 K/uL (ref 1.0–5.1)
MCH: 31.1 pg (ref 26.0–34.0)
MCHC: 34.3 g/dL (ref 31.0–36.0)
MCV: 90.8 fL (ref 80.0–100.0)
MPV: 7.7 fL (ref 5.0–10.5)
Monocytes %: 7.5 %
Monocytes Absolute: 0.5 K/uL (ref 0.0–1.3)
Neutrophils %: 66.2 %
Neutrophils Absolute: 4 K/uL (ref 1.7–7.7)
Platelets: 274 K/uL (ref 135–450)
RBC: 4.43 M/uL (ref 4.00–5.20)
RDW: 14.1 % (ref 12.4–15.4)
WBC: 6 K/uL (ref 4.0–11.0)

## 2024-03-28 LAB — HEMOGLOBIN A1C
Estimated Avg Glucose: 116.9 mg/dL
Hemoglobin A1C: 5.7 %

## 2024-04-02 NOTE — Telephone Encounter (Signed)
See Patient note 

## 2024-08-09 LAB — POCT INFLUENZA A/B DNA
Influenza virus A RNA: POSITIVE
Influenza virus B RNA: NEGATIVE
